# Patient Record
Sex: Male | Born: 1965
Health system: Southern US, Community
[De-identification: ages and names within clinical notes are randomized; demographics above are authoritative.]

## PROBLEM LIST (undated history)

## (undated) DIAGNOSIS — Z8679 Personal history of other diseases of the circulatory system: Secondary | ICD-10-CM

## (undated) DIAGNOSIS — Q631 Lobulated, fused and horseshoe kidney: Secondary | ICD-10-CM

## (undated) DIAGNOSIS — R519 Headache, unspecified: Secondary | ICD-10-CM

## (undated) DIAGNOSIS — I219 Acute myocardial infarction, unspecified: Secondary | ICD-10-CM

## (undated) DIAGNOSIS — I739 Peripheral vascular disease, unspecified: Secondary | ICD-10-CM

## (undated) DIAGNOSIS — J45909 Unspecified asthma, uncomplicated: Secondary | ICD-10-CM

## (undated) DIAGNOSIS — I10 Essential (primary) hypertension: Secondary | ICD-10-CM

## (undated) DIAGNOSIS — R51 Headache: Secondary | ICD-10-CM

## (undated) DIAGNOSIS — E7800 Pure hypercholesterolemia, unspecified: Secondary | ICD-10-CM

## (undated) DIAGNOSIS — I639 Cerebral infarction, unspecified: Secondary | ICD-10-CM

## (undated) DIAGNOSIS — T7840XA Allergy, unspecified, initial encounter: Secondary | ICD-10-CM

## (undated) HISTORY — PX: LEG SURGERY: SHX1003

## (undated) HISTORY — DX: Allergy, unspecified, initial encounter: T78.40XA

---

## 1999-05-19 ENCOUNTER — Emergency Department (HOSPITAL_COMMUNITY): Admission: EM | Admit: 1999-05-19 | Discharge: 1999-05-20 | Payer: Self-pay

## 2003-09-10 ENCOUNTER — Emergency Department (HOSPITAL_COMMUNITY): Admission: EM | Admit: 2003-09-10 | Discharge: 2003-09-10 | Payer: Self-pay | Admitting: Emergency Medicine

## 2004-01-21 ENCOUNTER — Ambulatory Visit: Payer: Self-pay | Admitting: Internal Medicine

## 2004-02-03 ENCOUNTER — Ambulatory Visit (HOSPITAL_COMMUNITY): Admission: RE | Admit: 2004-02-03 | Discharge: 2004-02-03 | Payer: Self-pay | Admitting: Internal Medicine

## 2006-10-12 ENCOUNTER — Emergency Department (HOSPITAL_COMMUNITY): Admission: EM | Admit: 2006-10-12 | Discharge: 2006-10-12 | Payer: Self-pay | Admitting: Emergency Medicine

## 2006-12-26 ENCOUNTER — Emergency Department (HOSPITAL_COMMUNITY): Admission: EM | Admit: 2006-12-26 | Discharge: 2006-12-27 | Payer: Self-pay | Admitting: Emergency Medicine

## 2007-01-15 ENCOUNTER — Emergency Department (HOSPITAL_COMMUNITY): Admission: EM | Admit: 2007-01-15 | Discharge: 2007-01-15 | Payer: Self-pay | Admitting: Family Medicine

## 2007-04-04 ENCOUNTER — Emergency Department (HOSPITAL_COMMUNITY): Admission: EM | Admit: 2007-04-04 | Discharge: 2007-04-04 | Payer: Self-pay | Admitting: Family Medicine

## 2007-11-04 ENCOUNTER — Inpatient Hospital Stay (HOSPITAL_COMMUNITY): Admission: EM | Admit: 2007-11-04 | Discharge: 2007-11-05 | Payer: Self-pay | Admitting: Family Medicine

## 2007-11-04 ENCOUNTER — Encounter (INDEPENDENT_AMBULATORY_CARE_PROVIDER_SITE_OTHER): Payer: Self-pay | Admitting: Internal Medicine

## 2007-11-04 ENCOUNTER — Ambulatory Visit: Payer: Self-pay | Admitting: Vascular Surgery

## 2008-01-30 ENCOUNTER — Ambulatory Visit: Payer: Self-pay | Admitting: Internal Medicine

## 2008-06-29 ENCOUNTER — Encounter: Payer: Self-pay | Admitting: Family Medicine

## 2008-06-29 ENCOUNTER — Ambulatory Visit: Payer: Self-pay | Admitting: Internal Medicine

## 2008-06-29 LAB — CONVERTED CEMR LAB
Alkaline Phosphatase: 44 units/L (ref 39–117)
BUN: 12 mg/dL (ref 6–23)
Basophils Absolute: 0.1 10*3/uL (ref 0.0–0.1)
Basophils Relative: 1 % (ref 0–1)
Creatinine, Ser: 0.94 mg/dL (ref 0.40–1.50)
Eosinophils Absolute: 0.2 10*3/uL (ref 0.0–0.7)
Glucose, Bld: 66 mg/dL — ABNORMAL LOW (ref 70–99)
HDL: 50 mg/dL (ref >39)
Hemoglobin: 13.8 g/dL (ref 13.0–17.0)
LDL Cholesterol: 156 mg/dL — ABNORMAL HIGH (ref 0–99)
MCHC: 31.7 g/dL (ref 30.0–36.0)
Monocytes Absolute: 0.8 10*3/uL (ref 0.1–1.0)
Neutro Abs: 4.8 10*3/uL (ref 1.7–7.7)
RDW: 15 % (ref 11.5–15.5)
Sodium: 143 meq/L (ref 135–145)
Total Bilirubin: 0.5 mg/dL (ref 0.3–1.2)
Total CHOL/HDL Ratio: 4.5
Total Protein: 7.5 g/dL (ref 6.0–8.3)
Triglycerides: 98 mg/dL (ref <150)
VLDL: 20 mg/dL (ref 0–40)

## 2008-07-16 ENCOUNTER — Ambulatory Visit: Payer: Self-pay | Admitting: Internal Medicine

## 2008-07-23 ENCOUNTER — Ambulatory Visit: Payer: Self-pay | Admitting: Internal Medicine

## 2008-08-27 ENCOUNTER — Emergency Department (HOSPITAL_COMMUNITY): Admission: EM | Admit: 2008-08-27 | Discharge: 2008-08-28 | Payer: Self-pay | Admitting: Emergency Medicine

## 2008-10-11 ENCOUNTER — Observation Stay (HOSPITAL_COMMUNITY): Admission: EM | Admit: 2008-10-11 | Discharge: 2008-10-12 | Payer: Self-pay | Admitting: Emergency Medicine

## 2008-10-11 ENCOUNTER — Emergency Department (HOSPITAL_COMMUNITY): Admission: EM | Admit: 2008-10-11 | Discharge: 2008-10-11 | Payer: Self-pay | Admitting: Emergency Medicine

## 2009-04-11 ENCOUNTER — Emergency Department (HOSPITAL_COMMUNITY): Admission: EM | Admit: 2009-04-11 | Discharge: 2009-04-11 | Payer: Self-pay | Admitting: Emergency Medicine

## 2009-07-06 ENCOUNTER — Encounter (INDEPENDENT_AMBULATORY_CARE_PROVIDER_SITE_OTHER): Payer: Self-pay | Admitting: Cardiology

## 2009-07-06 ENCOUNTER — Ambulatory Visit (HOSPITAL_COMMUNITY): Admission: RE | Admit: 2009-07-06 | Discharge: 2009-07-06 | Payer: Self-pay | Admitting: Cardiology

## 2009-12-22 ENCOUNTER — Emergency Department (HOSPITAL_COMMUNITY)
Admission: EM | Admit: 2009-12-22 | Discharge: 2009-12-22 | Payer: Self-pay | Source: Home / Self Care | Admitting: Emergency Medicine

## 2010-01-29 ENCOUNTER — Encounter: Payer: Self-pay | Admitting: Cardiology

## 2010-04-13 LAB — POCT CARDIAC MARKERS
CKMB, poc: 4 ng/mL (ref 1.0–8.0)
CKMB, poc: 4.2 ng/mL (ref 1.0–8.0)
Myoglobin, poc: 124 ng/mL (ref 12–200)
Troponin i, poc: <0.05 ng/mL (ref 0.00–0.09)

## 2010-04-13 LAB — CARDIAC PANEL(CRET KIN+CKTOT+MB+TROPI)
CK, MB: 5.6 ng/mL — ABNORMAL HIGH (ref 0.3–4.0)
Relative Index: 0.6 (ref 0.0–2.5)
Relative Index: 0.7 (ref 0.0–2.5)
Total CK: 856 U/L — ABNORMAL HIGH (ref 7–232)
Troponin I: 0.01 ng/mL (ref 0.00–0.06)

## 2010-04-13 LAB — DIFFERENTIAL
Basophils Absolute: 0 10*3/uL (ref 0.0–0.1)
Lymphocytes Relative: 23 % (ref 12–46)
Monocytes Absolute: 0.9 10*3/uL (ref 0.1–1.0)
Monocytes Relative: 10 % (ref 3–12)
Neutro Abs: 5.9 10*3/uL (ref 1.7–7.7)
Neutrophils Relative %: 66 % (ref 43–77)

## 2010-04-13 LAB — CBC
HCT: 41 % (ref 39.0–52.0)
MCHC: 33.5 g/dL (ref 30.0–36.0)
MCV: 92.1 fL (ref 78.0–100.0)
Platelets: 204 10*3/uL (ref 150–400)
RDW: 13.5 % (ref 11.5–15.5)

## 2010-04-13 LAB — TSH: TSH: 0.987 u[IU]/mL (ref 0.350–4.500)

## 2010-04-13 LAB — HEMOGLOBIN A1C: Mean Plasma Glucose: 117 mg/dL

## 2010-04-13 LAB — LIPID PANEL: Triglycerides: 148 mg/dL (ref <150)

## 2010-04-13 LAB — COMPREHENSIVE METABOLIC PANEL
Albumin: 3.7 g/dL (ref 3.5–5.2)
BUN: 6 mg/dL (ref 6–23)
Creatinine, Ser: 0.87 mg/dL (ref 0.4–1.5)
Glucose, Bld: 95 mg/dL (ref 70–99)
Total Protein: 6.6 g/dL (ref 6.0–8.3)

## 2010-04-13 LAB — CK TOTAL AND CKMB (NOT AT ARMC)
CK, MB: 7.7 ng/mL — ABNORMAL HIGH (ref 0.3–4.0)
Relative Index: 0.7 (ref 0.0–2.5)

## 2010-04-13 LAB — PROTIME-INR: Prothrombin Time: 13.3 seconds (ref 11.6–15.2)

## 2010-04-13 LAB — MAGNESIUM: Magnesium: 2.2 mg/dL (ref 1.5–2.5)

## 2010-05-23 NOTE — H&P (Signed)
NAMEJAKYREN, Jesse Sanders              ACCOUNT NO.:  [id number]   MEDICAL RECORD NO.:  [id number]          PATIENT TYPE:  EMS   LOCATION:  URG                          FACILITY:  [facility]   PHYSICIAN:  Maryla Morrow, MD        DATE OF BIRTH:  1965/03/21   DATE OF ADMISSION:  11/04/2007  DATE OF DISCHARGE:                              HISTORY & PHYSICAL   PRIMARY CARE PHYSICIAN:  Unassigned.   HISTORY OF PRESENT ILLNESS:  Jesse Sanders is a very pleasant 45 year old  gentleman with history of tobacco abuse and no other medical history who  presents today with chief complaint of sudden onset of right-sided  weakness with slurred speech, which resolved spontaneously.  He was  concerned about the symptoms and came to the ER for the evaluation.  Here he had no evidence of any speech or movement deficit per ER  evaluation.  He denied any fever or chills.  He denied any trauma to the  head.  He denies any chest pain, palpitations, or any seizures prior or  after the procedure.   PAST MEDICAL HISTORY:  Tobacco abuse, otherwise negative for  hypertension, diabetes, or hyperlipidemia.   FAMILY MEDICAL HISTORY:  Positive for history of CVA x2 in father,  otherwise negative for diabetes or hypertension.   PAST SURGICAL HISTORY:  None known.   MEDICATIONS:  None.   ALLERGIES:  No known drug allergies.   SOCIAL HISTORY:  The patient is smoking about a pack of cigarettes a  day.  Denies tobacco, alcohol, or drug abuse.  He is employed and lives  with his wife.   REVIEW OF SYSTEMS:  All pertinent positive as noted in the HPI.  Rest  all are negative.  All system reviewed with patient in detail.   PHYSICAL EXAMINATION:  VITAL SIGNS:  Temperature at present 98.2, blood  pressure 142/80, pulse 72, respiration 18, and saturations 100% on room  air.  GENERAL:  The patient is well built, well nourished, alert and oriented  to time, place, and person.  No acute distress.  HEENT:  Pupils equal, round, and  reactive to light.  Extraocular muscles  intact.  Head is atraumatic and normocephalic.  Oropharynx is clear.  NECK:  Supple.  No JVD.  No lymphadenopathy.  CHEST:  Clear to auscultation bilaterally.  Equal expansion bilaterally.  HEART:  Regular rate and rhythm.  S1 and S2 normal.  ABDOMEN:  Soft and nontender.  EXTREMITIES:  No evidence of cyanosis or clubbing.  Distal pulses 2+  bilaterally.  NEUROLOGIC:  Strength is 5/5 in 4 extremities.  Sensation is intact.  Speech is normal.  PSYCHIATRIC:  Mood and affect normal.  SKIN:  No evidence of rash or ulceration.   LABORATORY DATA:  Labs are reviewed and are unremarkable.   ASSESSMENT AND PLAN:  1. Transient ischemic attack versus cerebrovascular accident.  2. Tobacco abuse.  3. Family history of cerebrovascular accident.   RECOMMENDATIONS:  1. We will admit the patient to observation and will have CT with MRI      and MRA of the brain as  well as carotid ultrasound and 2-D      echocardiogram.  Start the patient on aspirin.  2. We will also check fasting lipid panel and hemoglobin A1c.  3. P.r.n. medications.      Maryla Morrow, MD  Electronically Signed     AP/MEDQ  D:  11/04/2007  T:  11/04/2007  Job:  [id number]

## 2010-05-26 NOTE — Discharge Summary (Signed)
Jesse Sanders, Jesse Sanders              ACCOUNT NO.:  [id number]   MEDICAL RECORD NO.:  [id number]          PATIENT TYPE:  INP   LOCATION:  3013                         FACILITY:  [facility]   PHYSICIAN:  Lonia Blood, M.D.       DATE OF BIRTH:  [birth year]   DATE OF ADMISSION:  11/04/2007  DATE OF DISCHARGE:  11/05/2007                               DISCHARGE SUMMARY   PRIMARY CARE PHYSICIAN:  [facility].   DISCHARGE DIAGNOSES:  1. Transient ischemic attack.  2. Cerebral small vessel disease.  3. Hyperlipidemia.  4. Tobacco abuse.  5. Alcohol use.   DISCHARGE MEDICATIONS:  1. Aspirin 81 mg daily.  2. Chantix starter pack.  3. Simvastatin 20 mg at bedtime.   CONDITION ON DISCHARGE:  Mr. Baney is discharged in good condition.  He  is alert, oriented, and neurologically intact.  He was told to follow up  with [facility].   PROCEDURES DURING THIS ADMISSION:  1. The patient underwent carotid Dopplers which were negative for ICA      stenosis.  2. Transthoracic echocardiogram which was negative for thrombi in the      heart.  3. MRI and MRA of the brain.  Findings of no acute stroke, nonspecific      white matter lesions, small vessel disease, and normal intracranial      MR angiography of the large and medium-sized vessels.   HISTORY AND PHYSICAL:  Refer to the dictated H&P which was on November 04, 2007, by Dr. Allena Katz.   HOSPITAL COURSE:  Mr. Seppala was admitted with transient ischemic attack  symptoms on November 04, 2007.  The patient was placed on telemetry and  he had serial neurological exams.  He remained neurologically intact  throughout this admission.  His workup revealed presence of small vessel  disease and this was the probable cause of his TIA.  It was stressed to  the patient the importance of smoking cessation, aspirin usage, and  follow up with the primary care physician.  Mr. Linse was discharged in  good condition on November 05, 2007.     Lonia Blood, M.D.  Electronically Signed    SL/MEDQ  D:  11/19/2007  T:  11/20/2007  Job:  [id number]   cc:   Melvern Banker

## 2010-09-27 LAB — POCT URINALYSIS DIP (DEVICE)
Bilirubin Urine: NEGATIVE
Glucose, UA: NEGATIVE
Ketones, ur: 15 — AB
Operator id: 247071
Specific Gravity, Urine: 1.02

## 2010-10-09 LAB — COMPREHENSIVE METABOLIC PANEL
ALT: 13
Albumin: 3.7
Alkaline Phosphatase: 32 — ABNORMAL LOW
BUN: 7
Calcium: 9.3
Glucose, Bld: 83
Potassium: 4.8
Sodium: 140
Total Protein: 6.1

## 2010-10-09 LAB — URINE DRUGS OF ABUSE SCREEN W ALC, ROUTINE (REF LAB)
Amphetamine Screen, Ur: NEGATIVE
Cocaine Metabolites: NEGATIVE
Creatinine,U: 48
Ethyl Alcohol: <10
Marijuana Metabolite: NEGATIVE
Opiate Screen, Urine: NEGATIVE
Propoxyphene: NEGATIVE

## 2010-10-09 LAB — LIPID PANEL
Cholesterol: 168
HDL: 45
LDL Cholesterol: 102 — ABNORMAL HIGH
Triglycerides: 104

## 2010-10-09 LAB — URINE MICROSCOPIC-ADD ON

## 2010-10-09 LAB — URINALYSIS, ROUTINE W REFLEX MICROSCOPIC
Glucose, UA: NEGATIVE
Nitrite: NEGATIVE
Specific Gravity, Urine: 1.026
pH: 6

## 2010-10-09 LAB — RAPID URINE DRUG SCREEN, HOSP PERFORMED
Amphetamines: NOT DETECTED
Barbiturates: NOT DETECTED
Benzodiazepines: NOT DETECTED
Tetrahydrocannabinol: NOT DETECTED

## 2010-10-09 LAB — DIFFERENTIAL
Basophils Relative: 1
Lymphs Abs: 4.1 — ABNORMAL HIGH
Monocytes Absolute: 0.9
Monocytes Relative: 8
Neutro Abs: 6.4
Neutrophils Relative %: 55

## 2010-10-09 LAB — CBC
MCHC: 34.5
Platelets: 290
RDW: 14.2

## 2010-10-09 LAB — HEMOGLOBIN A1C: Mean Plasma Glucose: 117

## 2010-10-09 LAB — TSH: TSH: 1.389

## 2010-10-13 LAB — URINALYSIS, ROUTINE W REFLEX MICROSCOPIC
Glucose, UA: NEGATIVE
Ketones, ur: 15 — AB
Protein, ur: 100 — AB
Urobilinogen, UA: 1

## 2010-10-13 LAB — URINE CULTURE: Colony Count: 60000

## 2010-10-13 LAB — I-STAT 8, (EC8 V) (CONVERTED LAB)
Acid-Base Excess: 3 — ABNORMAL HIGH
Bicarbonate: 29.6 — ABNORMAL HIGH
Chloride: 104
HCT: 46
Hemoglobin: 15.6
Operator id: 208821
Sodium: 140
pCO2, Ven: 51.8 — ABNORMAL HIGH

## 2010-10-13 LAB — CBC
Hemoglobin: 13.7
MCHC: 33.8
RBC: 4.46
WBC: 10.2

## 2010-10-13 LAB — POCT I-STAT CREATININE: Creatinine, Ser: 1.1

## 2010-10-13 LAB — DIFFERENTIAL
Basophils Relative: 0
Lymphocytes Relative: 27
Monocytes Absolute: 0.8
Monocytes Relative: 7
Neutro Abs: 6.5
Neutrophils Relative %: 64

## 2010-10-13 LAB — URINE MICROSCOPIC-ADD ON

## 2011-01-09 DIAGNOSIS — I639 Cerebral infarction, unspecified: Secondary | ICD-10-CM

## 2011-01-09 HISTORY — DX: Cerebral infarction, unspecified: I63.9

## 2011-04-07 ENCOUNTER — Emergency Department (HOSPITAL_BASED_OUTPATIENT_CLINIC_OR_DEPARTMENT_OTHER)
Admission: EM | Admit: 2011-04-07 | Discharge: 2011-04-07 | Disposition: A | Discharge status: Home / Self Care | Payer: 59 | Attending: Emergency Medicine | Admitting: Emergency Medicine

## 2011-04-07 ENCOUNTER — Encounter (HOSPITAL_BASED_OUTPATIENT_CLINIC_OR_DEPARTMENT_OTHER): Payer: Self-pay | Admitting: *Deleted

## 2011-04-07 ENCOUNTER — Emergency Department (INDEPENDENT_AMBULATORY_CARE_PROVIDER_SITE_OTHER): Payer: 59

## 2011-04-07 DIAGNOSIS — M25579 Pain in unspecified ankle and joints of unspecified foot: Secondary | ICD-10-CM

## 2011-04-07 DIAGNOSIS — M1990 Unspecified osteoarthritis, unspecified site: Secondary | ICD-10-CM | POA: Insufficient documentation

## 2011-04-07 DIAGNOSIS — Z8673 Personal history of transient ischemic attack (TIA), and cerebral infarction without residual deficits: Secondary | ICD-10-CM | POA: Insufficient documentation

## 2011-04-07 DIAGNOSIS — I10 Essential (primary) hypertension: Secondary | ICD-10-CM | POA: Insufficient documentation

## 2011-04-07 DIAGNOSIS — X58XXXA Exposure to other specified factors, initial encounter: Secondary | ICD-10-CM | POA: Insufficient documentation

## 2011-04-07 DIAGNOSIS — E7800 Pure hypercholesterolemia, unspecified: Secondary | ICD-10-CM | POA: Insufficient documentation

## 2011-04-07 HISTORY — DX: Lobulated, fused and horseshoe kidney: Q63.1

## 2011-04-07 HISTORY — DX: Pure hypercholesterolemia, unspecified: E78.00

## 2011-04-07 HISTORY — DX: Cerebral infarction, unspecified: I63.9

## 2011-04-07 HISTORY — DX: Peripheral vascular disease, unspecified: I73.9

## 2011-04-07 HISTORY — DX: Essential (primary) hypertension: I10

## 2011-04-07 MED ORDER — TRAMADOL HCL 50 MG PO TABS: 50.0000 mg | ORAL_TABLET | Freq: Four times a day (QID) | ORAL | Status: AC | PRN

## 2011-04-07 NOTE — ED Provider Notes (Signed)
History     CSN: [id number]  Arrival date & time 04/07/11  1951   First MD Initiated Contact with Patient 04/07/11 2234      Chief Complaint  Patient presents with  . Ankle Pain    (Consider location/radiation/quality/duration/timing/severity/associated sxs/prior treatment) HPI Comments: Pt denies any known injury:pt states that he has taken his father gout medication and it seems to help:pt is unsure of what it was:pt states that he has not had redness, warmth or swelling to area  Patient is a 46 y.o. male presenting with ankle pain. The history is provided by the patient. No language interpreter was used.  Ankle Pain  The incident occurred more than 1 week ago. There was no injury mechanism. The pain is present in the left ankle. The quality of the pain is described as aching. The pain is moderate. The pain has been constant since onset. Pertinent negatives include no loss of motion and no muscle weakness. He reports no foreign bodies present. The symptoms are aggravated by nothing.    Past Medical History  Diagnosis Date  . Horseshoe kidney   . Hypertension   . High cholesterol   . Small vessel disease   . Heart attack   . Stroke     Past Surgical History  Procedure Date  . Leg surgery     No family history on file.  History  Substance Use Topics  . Smoking status: Current Everyday Smoker  . Smokeless tobacco: Never Used  . Alcohol Use: No     last drink 3 years ago      Review of Systems  Constitutional: Negative.   Respiratory: Negative.   Cardiovascular: Negative.   Skin: Negative.   Neurological: Negative.     Allergies  Review of patient's allergies indicates no known allergies.  Home Medications  No current outpatient prescriptions on file.  BP 134/74  Pulse 76  Temp(Src) 98.6 F (37 C) (Oral)  Resp 18  Ht 5\' 11"  (1.803 m)  Wt 185 lb (83.915 kg)  BMI 25.80 kg/m2  SpO2 98%  Physical Exam  Nursing note and vitals  reviewed. Constitutional: He is oriented to person, place, and time. He appears well-developed and well-nourished.  Cardiovascular: Normal rate and regular rhythm.   Pulmonary/Chest: Effort normal and breath sounds normal.  Musculoskeletal: Normal range of motion.       No obvious swelling or deformity noted to the ankle:pt has full rom  Neurological: He is alert and oriented to person, place, and time.  Skin: Skin is warm.  Psychiatric: He has a normal mood and affect.    ED Course  Procedures (including critical care time)  Labs Reviewed - No data to display Dg Ankle Complete Left  04/07/2011  *RADIOLOGY REPORT*  Clinical Data: Lateral ankle pain.  No known injury.  LEFT ANKLE COMPLETE - 3+ VIEW  Comparison: None.  Findings: The left ankle appears intact.  No evidence of acute fracture or subluxation.  No focal bone lesion or bone destruction. No abnormal periosteal reaction.  Bone cortex and trabecular architecture appear intact.  Degenerative changes suggested in the midfoot tarsal joints.  Ankle mortis and talar dome appear intact. No radiopaque soft tissue foreign bodies.  IMPRESSION: No acute bony abnormalities identified.  Original Report Authenticated By: Marlon Pel, M.D.     1. Degenerative joint disease       MDM  Pt is instructed on finding:pt requesting aso for comfort  Teressa Lower, NP 04/07/11 [phone]

## 2011-04-07 NOTE — ED Notes (Signed)
Pt reports left ankle pain x 3 months- reports he has been taking his father's "gout pills"- denies known injury

## 2011-04-07 NOTE — Discharge Instructions (Signed)
Degenerative Arthritis  You have osteoarthritis. This is the wear and tear arthritis that comes with aging. It is also called degenerative arthritis. This is common in people past middle age. It is caused by stress on the joints. The large weight bearing joints of the lower extremities are most often affected. The knees, hips, back, neck, and hands can become painful, swollen, and stiff. This is the most common type of arthritis. It comes on with age, carrying too much weight, or from an injury.  Treatment includes resting the sore joint until the pain and swelling improve. Crutches or a walker may be needed for severe flares. Only take over-the-counter or prescription medicines for pain, discomfort, or fever as directed by your caregiver. Local heat therapy may improve motion. Cortisone shots into the joint are sometimes used to reduce pain and swelling during flares.  Osteoarthritis is usually not crippling and progresses slowly. There are things you can do to decrease pain:  · Avoid high impact activities.  · Exercise regularly.  · Low impact exercises such as walking, biking and swimming help to keep the muscles strong and keep normal joint function.  · Stretching helps to keep your range of motion.  · Lose weight if you are overweight. This reduces joint stress.  In severe cases when you have pain at rest or increasing disability, joint surgery may be helpful. See your caregiver for follow-up treatment as recommended.   SEEK IMMEDIATE MEDICAL CARE IF:   · You have severe joint pain.  · Marked swelling and redness in your joint develops.  · You develop a high fever.  Document Released: 12/25/2004 Document Revised: 12/14/2010 Document Reviewed: 05/27/2006  Exit[employer]® Patient Information ©2012 [employer].

## 2011-04-08 NOTE — ED Provider Notes (Signed)
Medical screening examination/treatment/procedure(s) were performed by non-physician practitioner and as supervising physician I was immediately available for consultation/collaboration.  Infant Doane, MD 04/08/11 0001 

## 2011-05-10 ENCOUNTER — Ambulatory Visit: Payer: 59 | Admitting: Internal Medicine

## 2011-06-15 ENCOUNTER — Ambulatory Visit: Payer: 59 | Admitting: Internal Medicine

## 2011-06-15 DIAGNOSIS — Z0289 Encounter for other administrative examinations: Secondary | ICD-10-CM

## 2012-01-09 DIAGNOSIS — I219 Acute myocardial infarction, unspecified: Secondary | ICD-10-CM

## 2012-01-09 HISTORY — DX: Acute myocardial infarction, unspecified: I21.9

## 2012-02-26 ENCOUNTER — Emergency Department (HOSPITAL_COMMUNITY)
Admission: EM | Admit: 2012-02-26 | Discharge: 2012-02-27 | Disposition: A | Discharge status: Home / Self Care | Payer: 59 | Attending: Emergency Medicine | Admitting: Emergency Medicine

## 2012-02-26 ENCOUNTER — Encounter (HOSPITAL_COMMUNITY): Payer: Self-pay | Admitting: Emergency Medicine

## 2012-02-26 ENCOUNTER — Emergency Department (HOSPITAL_COMMUNITY): Payer: 59

## 2012-02-26 DIAGNOSIS — Z8673 Personal history of transient ischemic attack (TIA), and cerebral infarction without residual deficits: Secondary | ICD-10-CM | POA: Insufficient documentation

## 2012-02-26 DIAGNOSIS — M531 Cervicobrachial syndrome: Secondary | ICD-10-CM | POA: Insufficient documentation

## 2012-02-26 DIAGNOSIS — F17200 Nicotine dependence, unspecified, uncomplicated: Secondary | ICD-10-CM | POA: Insufficient documentation

## 2012-02-26 DIAGNOSIS — Z8674 Personal history of sudden cardiac arrest: Secondary | ICD-10-CM | POA: Insufficient documentation

## 2012-02-26 DIAGNOSIS — Z23 Encounter for immunization: Secondary | ICD-10-CM | POA: Insufficient documentation

## 2012-02-26 DIAGNOSIS — S61219A Laceration without foreign body of unspecified finger without damage to nail, initial encounter: Secondary | ICD-10-CM

## 2012-02-26 DIAGNOSIS — S61209A Unspecified open wound of unspecified finger without damage to nail, initial encounter: Secondary | ICD-10-CM | POA: Insufficient documentation

## 2012-02-26 DIAGNOSIS — W261XXA Contact with sword or dagger, initial encounter: Secondary | ICD-10-CM | POA: Insufficient documentation

## 2012-02-26 DIAGNOSIS — Z862 Personal history of diseases of the blood and blood-forming organs and certain disorders involving the immune mechanism: Secondary | ICD-10-CM | POA: Insufficient documentation

## 2012-02-26 DIAGNOSIS — Y93G9 Activity, other involving cooking and grilling: Secondary | ICD-10-CM | POA: Insufficient documentation

## 2012-02-26 DIAGNOSIS — Y9289 Other specified places as the place of occurrence of the external cause: Secondary | ICD-10-CM | POA: Insufficient documentation

## 2012-02-26 DIAGNOSIS — Z8639 Personal history of other endocrine, nutritional and metabolic disease: Secondary | ICD-10-CM | POA: Insufficient documentation

## 2012-02-26 DIAGNOSIS — Z7982 Long term (current) use of aspirin: Secondary | ICD-10-CM | POA: Insufficient documentation

## 2012-02-26 DIAGNOSIS — W260XXA Contact with knife, initial encounter: Secondary | ICD-10-CM | POA: Insufficient documentation

## 2012-02-26 DIAGNOSIS — Z8679 Personal history of other diseases of the circulatory system: Secondary | ICD-10-CM | POA: Insufficient documentation

## 2012-02-26 DIAGNOSIS — I10 Essential (primary) hypertension: Secondary | ICD-10-CM | POA: Insufficient documentation

## 2012-02-26 MED ORDER — TETANUS-DIPHTH-ACELL PERTUSSIS 5-2.5-18.5 LF-MCG/0.5 IM SUSP
0.5000 mL | Freq: Once | INTRAMUSCULAR | Status: AC
  Administered 2012-02-26: 0.5 mL via INTRAMUSCULAR

## 2012-02-26 MED ORDER — TETANUS-DIPHTH-ACELL PERTUSSIS 5-2.5-18.5 LF-MCG/0.5 IM SUSP
INTRAMUSCULAR | Status: AC
  Administered 2012-02-26: 0.5 mL via INTRAMUSCULAR
  Filled 2012-02-26: qty 0.5

## 2012-02-26 NOTE — ED Notes (Signed)
Pt was chopping lettuce at work and mistakenly cut his L pinky finger.

## 2012-02-26 NOTE — ED Provider Notes (Signed)
History    This chart was scribed for non-physician practitioner working with Gilda Crease, * by Toya Smothers, ED Scribe. This patient was seen in room TR11C/TR11C and the patient's care was started at 11:29.  CSN: [id number]  Arrival date & time 02/26/12  2012   First MD Initiated Contact with Patient 02/26/12 2328      Chief Complaint  Patient presents with  . Finger Injury    Patient is a 47 y.o. male presenting with skin laceration. The history is provided by the patient. No language interpreter was used.  Laceration Location:  Finger Finger laceration location:  L little finger Quality: avulsion   Bleeding: controlled   Time since incident:  2 hours Pain details:    Quality:  Dull   Severity:  Mild   Timing:  Rare   Progression:  Resolved Foreign body present:  No foreign bodies Relieved by:  Pressure Worsened by:  Nothing tried Ineffective treatments:  None tried Tetanus status:  Unknown   Harlyn Italiano is a 47 y.o. male with h/o HTN, who presents to the Emergency Department complaining of superficial laceration flap to the distal aspect of the right little finger which occurred 2 hours PTA. Per Pt, pain has subsided, though it was initially sharp and aching. Onset occurred while cutting food with a sharp knife. Bleeding was moderate, though controlled prior to arrival. No fever, chills, cough, congestion, rhinorrhea, chest pain, SOB, or n/v/d. Pt denies use of tobacco, alcohol, and illicit drug use. Pt is unaware of last tetanus shot and is non-diabetic. No pertinent surgical Hx denoted.     Past Medical History  Diagnosis Date  . Horseshoe kidney   . Hypertension   . High cholesterol   . Small vessel disease   . Heart attack   . Stroke     Past Surgical History  Procedure Laterality Date  . Leg surgery      History reviewed. No pertinent family history.  History  Substance Use Topics  . Smoking status: Current Every Day Smoker  . Smokeless  tobacco: Never Used  . Alcohol Use: No     Comment: last drink 3 years ago      Review of Systems  Skin: Positive for wound.  All other systems reviewed and are negative.    Allergies  Review of patient's allergies indicates no known allergies.  Home Medications   Current Outpatient Rx  Name  Route  Sig  Dispense  Refill  . aspirin EC 81 MG tablet   Oral   Take 81 mg by mouth daily.           BP 142/94  Pulse 72  Temp(Src) 98.5 F (36.9 C) (Oral)  Resp 16  SpO2 96%  Physical Exam  Nursing note and vitals reviewed. Constitutional: He appears well-developed and well-nourished. No distress.  HENT:  Head: Normocephalic and atraumatic.  Right Ear: External ear normal.  Left Ear: External ear normal.  Eyes: Conjunctivae are normal.  Cardiovascular: Normal rate.   Pulmonary/Chest: Effort normal. No respiratory distress.  Musculoskeletal: He exhibits no edema.  Neurological: He is alert. Cranial nerve deficit: no gross deficits.  Skin: Skin is warm and dry. No rash noted.  Superficial flap laceration to distal aspect of the left fifth pharyngeal.   Psychiatric: He has a normal mood and affect.    ED Course  Procedures  DIAGNOSTIC STUDIES: Oxygen Saturation is 96% on room air, normal by my interpretation.    COORDINATION OF  CARE: 20:46- Ordered DG Finger Little Left 1 time imaging. 23:29- Evaluated Pt. Pt is awake, alert, and without distress. 23:33- Patient understand and agree with initial ED impression and plan with expectations set for ED visit. 23:37- Ordered Wound care and Apply finger splint static Once.  Labs Reviewed - No data to display Dg Finger Little Left  02/26/2012  *RADIOLOGY REPORT*  Clinical Data: Laceration to tip of fifth finger  LEFT LITTLE FINGER 2+V  Comparison: None.  Findings: No acute osseous injury or retained radiopaque foreign body.  Irregularity of the soft tissues of the distal finger consistent with laceration.  IMPRESSION:  Laceration of the distal fifth finger without evidence of underlying osseous involvement or retained radiopaque foreign body.   Original Report Authenticated By: Malachy Moan, M.D.      No diagnosis found.  Patient discussed with and seen by Dr. Blinda Leatherwood. After discussion, patient has opted for healing via secondary intention.  Wound care provided, protective splint applied, tetanus updated   MDM    I personally performed the services described in this documentation, which was scribed in my presence. The recorded information has been reviewed and is accurate.        Jimmye Norman, NP 02/27/12 [phone]

## 2012-02-26 NOTE — ED Notes (Signed)
Patient reports that he cut his left pinky finger with a knife while cutting lettuce at work.  Denies that it is a worker's comp case.  Bleeding controlled at this time; no active bleeding noted.

## 2012-02-26 NOTE — ED Notes (Signed)
Cleaned pt's finger with sterile saline.

## 2012-02-27 NOTE — ED Provider Notes (Signed)
Medical screening examination/treatment/procedure(s) were performed by non-physician practitioner and as supervising physician I was immediately available for consultation/collaboration.  Christopher J. Pollina, MD 02/27/12 1214 

## 2015-02-19 ENCOUNTER — Encounter (HOSPITAL_COMMUNITY): Payer: Self-pay | Admitting: Emergency Medicine

## 2015-02-19 ENCOUNTER — Emergency Department (INDEPENDENT_AMBULATORY_CARE_PROVIDER_SITE_OTHER)
Admission: EM | Admit: 2015-02-19 | Discharge: 2015-02-19 | Disposition: A | Discharge status: Home / Self Care | Payer: Self-pay | Source: Home / Self Care | Attending: Family Medicine | Admitting: Family Medicine

## 2015-02-19 DIAGNOSIS — K047 Periapical abscess without sinus: Secondary | ICD-10-CM

## 2015-02-19 MED ORDER — CLINDAMYCIN HCL 300 MG PO CAPS: 300.0000 mg | ORAL_CAPSULE | Freq: Three times a day (TID) | ORAL | Status: DC

## 2015-02-19 MED ORDER — IBUPROFEN 800 MG PO TABS: 800.0000 mg | ORAL_TABLET | Freq: Three times a day (TID) | ORAL | Status: DC

## 2015-02-19 NOTE — ED Notes (Signed)
Reports right lower toothache that started Wednesday.  Patient reports swelling has occurred since last night.

## 2015-02-19 NOTE — ED Provider Notes (Addendum)
CSN: [id number]     Arrival date & time 02/19/15  1700 History   First MD Initiated Contact with Patient 02/19/15 1725     Chief Complaint  Patient presents with  . Dental Pain  . Facial Swelling   (Consider location/radiation/quality/duration/timing/severity/associated sxs/prior Treatment) Patient is a 50 y.o. male presenting with tooth pain. The history is provided by the patient.  Dental Pain Location:  Lower Lower teeth location:  30/RL 1st molar Quality:  Throbbing Severity:  Moderate Onset quality:  Gradual Duration:  3 days Progression:  Worsening Chronicity:  New Context: abscess, dental caries and poor dentition   Relieved by:  None tried Worsened by:  Nothing tried Ineffective treatments:  None tried Associated symptoms: facial swelling   Associated symptoms: no fever   Associated symptoms comment:  Onset last eve. Risk factors: lack of dental care and smoking   Risk factors: no alcohol problem     Past Medical History  Diagnosis Date  . Horseshoe kidney   . Hypertension   . High cholesterol   . Small vessel disease (HCC)   . Heart attack (HCC)   . Stroke [facility])    Past Surgical History  Procedure Laterality Date  . Leg surgery     No family history on file. Social History  Substance Use Topics  . Smoking status: Current Every Day Smoker  . Smokeless tobacco: Never Used  . Alcohol Use: No     Comment: last drink 3 years ago    Review of Systems  Constitutional: Negative.  Negative for fever.  HENT: Positive for dental problem and facial swelling.   All other systems reviewed and are negative.   Allergies  Review of patient's allergies indicates no known allergies.  Home Medications   Prior to Admission medications   Medication Sig Start Date End Date Taking? Authorizing Provider  aspirin EC 81 MG tablet Take 81 mg by mouth daily.    Historical Provider, MD  clindamycin (CLEOCIN) 300 MG capsule Take 1 capsule (300 mg total) by mouth 3 (three)  times daily. 02/19/15   Linna Hoff, MD  ibuprofen (ADVIL,MOTRIN) 800 MG tablet Take 1 tablet (800 mg total) by mouth 3 (three) times daily. 02/19/15   Linna Hoff, MD   Meds Ordered and Administered this Visit  Medications - No data to display  BP 153/106 mmHg  Pulse 89  Temp(Src) 99.4 F (37.4 C) (Oral)  Resp 18  SpO2 98% No data found.   Physical Exam  Constitutional: He is oriented to person, place, and time. He appears well-developed and well-nourished. He appears distressed.  HENT:  Right Ear: External ear normal.  Left Ear: External ear normal.  Mouth/Throat: Uvula is midline and mucous membranes are normal. Oral lesions present. Abnormal dentition.    Neck: Normal range of motion. Neck supple.  Lymphadenopathy:    He has no cervical adenopathy.  Neurological: He is alert and oriented to person, place, and time.  Skin: Skin is warm.  Nursing note and vitals reviewed.   ED Course  Procedures (including critical care time)  Labs Review Labs Reviewed - No data to display  Imaging Review No results found.   Visual Acuity Review  Right Eye Distance:   Left Eye Distance:   Bilateral Distance:    Right Eye Near:   Left Eye Near:    Bilateral Near:         MDM   1. Abscess, dental     Meds ordered this  encounter  Medications  . ibuprofen (ADVIL,MOTRIN) 800 MG tablet    Sig: Take 1 tablet (800 mg total) by mouth 3 (three) times daily.    Dispense:  30 tablet    Refill:  0  . clindamycin (CLEOCIN) 300 MG capsule    Sig: Take 1 capsule (300 mg total) by mouth 3 (three) times daily.    Dispense:  21 capsule    Refill:  0      Linna Hoff, MD 02/19/15 1746  Linna Hoff, MD 02/19/15 [phone]

## 2015-02-19 NOTE — Discharge Instructions (Signed)
Take medicine as prescribed, see your dentist as soon as possible °

## 2015-04-03 ENCOUNTER — Emergency Department (HOSPITAL_BASED_OUTPATIENT_CLINIC_OR_DEPARTMENT_OTHER)
Admission: EM | Admit: 2015-04-03 | Discharge: 2015-04-03 | Disposition: A | Discharge status: Home / Self Care | Payer: No Typology Code available for payment source | Attending: Emergency Medicine | Admitting: Emergency Medicine

## 2015-04-03 ENCOUNTER — Encounter (HOSPITAL_BASED_OUTPATIENT_CLINIC_OR_DEPARTMENT_OTHER): Payer: Self-pay | Admitting: *Deleted

## 2015-04-03 DIAGNOSIS — I10 Essential (primary) hypertension: Secondary | ICD-10-CM | POA: Insufficient documentation

## 2015-04-03 DIAGNOSIS — Q631 Lobulated, fused and horseshoe kidney: Secondary | ICD-10-CM | POA: Diagnosis not present

## 2015-04-03 DIAGNOSIS — K047 Periapical abscess without sinus: Secondary | ICD-10-CM | POA: Diagnosis not present

## 2015-04-03 DIAGNOSIS — F17200 Nicotine dependence, unspecified, uncomplicated: Secondary | ICD-10-CM | POA: Diagnosis not present

## 2015-04-03 DIAGNOSIS — R220 Localized swelling, mass and lump, head: Secondary | ICD-10-CM | POA: Diagnosis present

## 2015-04-03 DIAGNOSIS — Z8673 Personal history of transient ischemic attack (TIA), and cerebral infarction without residual deficits: Secondary | ICD-10-CM | POA: Diagnosis not present

## 2015-04-03 DIAGNOSIS — Z791 Long term (current) use of non-steroidal anti-inflammatories (NSAID): Secondary | ICD-10-CM | POA: Diagnosis not present

## 2015-04-03 DIAGNOSIS — Z7982 Long term (current) use of aspirin: Secondary | ICD-10-CM | POA: Insufficient documentation

## 2015-04-03 DIAGNOSIS — Z792 Long term (current) use of antibiotics: Secondary | ICD-10-CM | POA: Insufficient documentation

## 2015-04-03 DIAGNOSIS — Z8639 Personal history of other endocrine, nutritional and metabolic disease: Secondary | ICD-10-CM | POA: Diagnosis not present

## 2015-04-03 MED ORDER — PENICILLIN V POTASSIUM 250 MG PO TABS
500.0000 mg | ORAL_TABLET | Freq: Four times a day (QID) | ORAL | Status: DC
  Administered 2015-04-03: 500 mg via ORAL
  Filled 2015-04-03: qty 2

## 2015-04-03 MED ORDER — PENICILLIN V POTASSIUM 500 MG PO TABS: 500.0000 mg | ORAL_TABLET | Freq: Four times a day (QID) | ORAL | Status: AC

## 2015-04-03 NOTE — ED Notes (Signed)
Tracheal sounds clear, normal speech

## 2015-04-03 NOTE — ED Provider Notes (Signed)
CSN: [id number]     Arrival date & time 04/03/15  1616 History  By signing my name below, I, Iona Beard, attest that this documentation has been prepared under the direction and in the presence of Doug Sou, MD.   Electronically Signed: Iona Beard, ED Scribe. 04/03/2015. 6:08 PM   Chief Complaint  Patient presents with  . Facial Swelling   The history is provided by the patient. No language interpreter was used.   HPI Comments: Jesse Sanders is a 50 y.o. male with PMHx of horseshoe kidney, HLD, MI, stroke, and HTN who presents to the Emergency Department complaining of gradual onset, constant, left facial swelling, onset today around 12 PM.Today. Pt reports that he took an aleve tablet last night at 1 AM and woke up with a swollen face. He states pain with palpation of the area. He reports that he has had a similar reaction with aleve in the past. No other associated symptoms noted. No worsening or alleviating factors noted. Pt denies shortness of breath, trouble swallowing, denies fever. Pain is minimal at present and localized to left jaw. Pt is currently an everyday smoker and occasional drinker. Denies illicit substance use. Pt says he has an appointment with a dentist next month.   Past Medical History  Diagnosis Date  . Horseshoe kidney   . Hypertension   . High cholesterol   . Small vessel disease (HCC)   . Heart attack (HCC)   . Stroke [facility])    Past Surgical History  Procedure Laterality Date  . Leg surgery     No family history on file. Social History  Substance Use Topics  . Smoking status: Current Every Day Smoker  . Smokeless tobacco: Never Used  . Alcohol Use: No     Comment: last drink 3 years ago    Review of Systems  Constitutional: Negative.   HENT: Positive for dental problem.        Jaw pain  Respiratory: Negative.   Cardiovascular: Negative.   Gastrointestinal: Negative.   Musculoskeletal: Negative.   Skin: Negative.   Neurological:  Negative.   Psychiatric/Behavioral: Negative.   All other systems reviewed and are negative.  Allergies  Aleve  Home Medications   Prior to Admission medications   Medication Sig Start Date End Date Taking? Authorizing Provider  aspirin EC 81 MG tablet Take 81 mg by mouth daily.   Yes Historical Provider, MD  clindamycin (CLEOCIN) 300 MG capsule Take 1 capsule (300 mg total) by mouth 3 (three) times daily. 02/19/15   Linna Hoff, MD  ibuprofen (ADVIL,MOTRIN) 800 MG tablet Take 1 tablet (800 mg total) by mouth 3 (three) times daily. 02/19/15   Linna Hoff, MD   BP 156/101 mmHg  Pulse 88  Temp(Src) 99.1 F (37.3 C) (Oral)  Resp 16  SpO2 100% Physical Exam  Constitutional: He appears well-developed and well-nourished. No distress.  HENT:  Head: Normocephalic and atraumatic.  Right Ear: External ear normal.  Left Ear: External ear normal.  Teeth #25 and 26 are partially fractured and badly decayed. There is swelling and tenderness at the left mandible just lateral to the symphysis. No trismus. No tenderness or fluctuance of submandibular area. Tongue is normal. No fluctuance gingiva  Eyes: Conjunctivae are normal. Pupils are equal, round, and reactive to light.  Neck: Neck supple. No tracheal deviation present. No thyromegaly present.  Cardiovascular: Normal rate and regular rhythm.   No murmur heard. Pulmonary/Chest: Effort normal and breath sounds normal.  Abdominal: Soft. Bowel sounds are normal. He exhibits no distension. There is no tenderness.  Musculoskeletal: Normal range of motion. He exhibits no edema or tenderness.  Neurological: He is alert. Coordination normal.  Skin: Skin is warm and dry. No rash noted.  Psychiatric: He has a normal mood and affect.  Nursing note and vitals reviewed.   ED Course  Procedures (including critical care time) DIAGNOSTIC STUDIES: Oxygen Saturation is 100% on RA, normal by my interpretation.    COORDINATION OF CARE: 6:13  PM-Discussed treatment plan which includes penicillin with pt at bedside and pt agreed to plan.   Labs Review Labs Reviewed - No data to display  Imaging Review No results found. I have personally reviewed and evaluated these images and lab results as part of my medical decision-making.   EKG Interpretation None      MDM  I suspect the patient has dental abscess given his poor dentition and jaw tenderness and swelling. He is advised to avoid Aleve she's had similar episodes of in the past after taking Aleve however I don't feel that this is an allergic reaction. Patient has dental follow-up next month to not recall the date. He'll also be given referral to Dr. Norma FredricksonFarr list, dentist on call. Prescription Pen-Vee K. Blood pressure recheck 3 weeks Diagnoses #1 dental abscess #2 elevated blood pressure Final diagnoses:  None          Doug SouSam Tee Richeson, MD 04/03/15 1826

## 2015-04-03 NOTE — ED Notes (Signed)
States took two Aleve tablets last pm, states awoke today approx noon with rt lower facial swelling, denies any SOB, dysphagia or dysphonia, appears very comfortable at this time in waiting area, pt placed near Nurse First for constant observation

## 2015-04-03 NOTE — Discharge Instructions (Signed)
Dental Abscess Avoid Aleve. Instead take Tylenol for pain. Call your dentist tomorrow and ask if you can be seen sooner than your appointment next month. If your dentist cannot see you within a week, call Dr. Leanord AsalFarless to schedule next available office appointment and tell office staff that you were seen here when schedule the appointment. Your blood pressure was mildly elevated today at 155/93 and should be rechecked within the next 3 weeks at your doctor's office or at an urgent care center. A dental abscess is pus in or around a tooth. HOME CARE  Take medicines only as told by your dentist.  If you were prescribed antibiotic medicine, finish all of it even if you start to feel better.  Rinse your mouth (gargle) often with salt water.  Do not drive or use heavy machinery, like a lawn mower, while taking pain medicine.  Do not apply heat to the outside of your mouth.  Keep all follow-up visits as told by your dentist. This is important. GET HELP IF:  Your pain is worse, and medicine does not help. GET HELP RIGHT AWAY IF:  You have a fever or chills.  Your symptoms suddenly get worse.  You have a very bad headache.  You have problems breathing or swallowing.  You have trouble opening your mouth.  You have puffiness (swelling) in your neck or around your eye.   This information is not intended to replace advice given to you by your health care provider. Make sure you discuss any questions you have with your health care provider.   Document Released: 05/11/2014 Document Reviewed: 05/11/2014 Elsevier Interactive Patient Education [employer]2016 Elsevier Inc.

## 2015-04-04 MED FILL — PENICILLIN VK 500 MG TABLET: 500 | 10 days supply | Qty: 40 | Fill #0

## 2015-05-09 ENCOUNTER — Encounter: Payer: Self-pay | Admitting: Internal Medicine

## 2015-05-09 ENCOUNTER — Ambulatory Visit (INDEPENDENT_AMBULATORY_CARE_PROVIDER_SITE_OTHER): Payer: No Typology Code available for payment source | Admitting: Internal Medicine

## 2015-05-09 VITALS — BP 150/93 | HR 78 | Temp 98.3°F | Ht 71.0 in | Wt 177.8 lb

## 2015-05-09 DIAGNOSIS — Z8673 Personal history of transient ischemic attack (TIA), and cerebral infarction without residual deficits: Secondary | ICD-10-CM | POA: Insufficient documentation

## 2015-05-09 DIAGNOSIS — Q631 Lobulated, fused and horseshoe kidney: Secondary | ICD-10-CM | POA: Insufficient documentation

## 2015-05-09 DIAGNOSIS — M5412 Radiculopathy, cervical region: Secondary | ICD-10-CM | POA: Insufficient documentation

## 2015-05-09 DIAGNOSIS — I10 Essential (primary) hypertension: Secondary | ICD-10-CM | POA: Diagnosis not present

## 2015-05-09 DIAGNOSIS — M25511 Pain in right shoulder: Secondary | ICD-10-CM

## 2015-05-09 DIAGNOSIS — J309 Allergic rhinitis, unspecified: Secondary | ICD-10-CM

## 2015-05-09 DIAGNOSIS — J302 Other seasonal allergic rhinitis: Secondary | ICD-10-CM

## 2015-05-09 LAB — POCT GLYCOSYLATED HEMOGLOBIN (HGB A1C): Hemoglobin A1C: 5.5

## 2015-05-09 LAB — GLUCOSE, CAPILLARY: Glucose-Capillary: 84 mg/dL (ref 65–99)

## 2015-05-09 MED ORDER — MELOXICAM 10 MG PO CAPS: 10.0000 mg | ORAL_CAPSULE | Freq: Every day | ORAL | Status: DC | PRN

## 2015-05-09 MED ORDER — CETIRIZINE HCL 10 MG PO TABS: 10.0000 mg | ORAL_TABLET | Freq: Every day | ORAL | Status: DC

## 2015-05-09 MED ORDER — MOMETASONE FUROATE 50 MCG/ACT NA SUSP: NASAL | Status: DC

## 2015-05-09 MED ORDER — HYDROCHLOROTHIAZIDE 12.5 MG PO CAPS: 12.5000 mg | ORAL_CAPSULE | Freq: Every day | ORAL | Status: DC
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
## 2015-05-09 NOTE — Assessment & Plan Note (Signed)
Overview He also complains of sinus problems today. In [place], he reports feeling relief with cetirizine and Nasonex. Since he has been here, he has not found relief with over-the-counter remedies, like Sudafed, Tylenol sinus, Advil sinus. His symptoms consist mainly of itching, congestion, rhinorrhea though he denies postnasal drip when he wakes up or runny eyes.  Assessment Allergic rhinitis, likely seasonal.  Plan -Prescribed cetirizine 10 mg daily along with Nasonex 1-2 sprays per nostril daily

## 2015-05-09 NOTE — Patient Instructions (Addendum)
For allergies, please take Zyrtec daily and Nasonex.  For the shoulder pain, we will order an MRI to better assess what's going on. Please take meloxicam 10 mg daily for now.  For the blood pressure, please start taking HCTZ 12.5 mg daily.  We look forward to seeing you back in 4 weeks!

## 2015-05-09 NOTE — Assessment & Plan Note (Addendum)
Overview For the last month, he acknowledges the acute onset of right shoulder pain. He first noted pain while he was sleeping. His pain is worse when he is resting or inactive, like watching TV or attempting sleep at night. He scores the pain 7/10 though appears to improve throughout the day when he is active. He has not tried any over-the-counter medications for relief. About 10-12 years ago, he does acknowledge falling off a ladder at which time MRI imaging obtained was unremarkable for acute fracture or lesions.  Assessment Suspect cervical radiculopathy. I do not think he is having TIAs as the time course and occurrence would be inconsistent.  Plan -Prescribed meloxicam 10 mg daily -Ordered cervical MRI to assess for radiculopathy  ADDENDUM 06/07/2015  11:35 AM:  MRI 05/26/15 notable for right paracentral disc extrusion at C6/C7 with subsequent severe right-sided spinal stenosis and moderate right neural foraminal stenosis along with moderate spinal stenosis at C4/C5 with focal spinal cord myelopathic type signal change.  I have attempted to reach the patient to discuss results though no voicemail exists to leave a message.

## 2015-05-09 NOTE — Assessment & Plan Note (Addendum)
Overview He reports he had a prior history back in 2009 11 mini stroke. I was able to locate neuroimaging in the system which does not appear to suggest he had a massive cerebrovascular accident at that time. He denies any recurrent symptoms since that hospitalization. He does report adherence to daily aspirin 81 mg.  Assessment History of TIA. Risk factors for recurrence include poorly controlled blood pressure and ongoing tobacco use. Lipid status is unknown at this time.  Plan -Check lipid panel, A1c -Counseling tobacco use cessation at follow-up visit  ADDENDUM 05/10/2015  2:39 PM:  Per 2013 AHA/ACC ASCVD risk calculator, 10-year risk is 18.7 % and 3.5 % with optimal risk factors. High-intensity stain therapy is indicated, so we will start with atorvastatin 40 mg daily. I spoke with the patient by phone who is in agreement though is awaiting information from his insurance about coverage.  A1c 5.5 reassuring.

## 2015-05-09 NOTE — Assessment & Plan Note (Addendum)
Overview His blood pressure today is 150/93. He is not on any blood pressure medications currently and has never been told that he has elevated blood pressure. He denies any symptoms of hypertensive urgency, like headache, blurry vision, chest pain, difficulty breathing.  Assessment Essential hypertension, not at goal.  Plan -Check CMET -Prescribed HCTZ 12.5 mg to be taken daily with plan to up titrate to 25 mg if his blood pressure does not appear to be controlled at follow-up  ADDENDUM 05/10/2015  2:40 PM:  CBC, CMET reassuring for no abnormalities.

## 2015-05-10 LAB — CMP14 + ANION GAP
A/G RATIO: 1.6 (ref 1.2–2.2)
ALT: 43 IU/L (ref 0–44)
AST: 30 IU/L (ref 0–40)
Albumin: 4.3 g/dL (ref 3.5–5.5)
Alkaline Phosphatase: 56 IU/L (ref 39–117)
Anion Gap: 16 mmol/L (ref 10.0–18.0)
BUN/Creatinine Ratio: 8 — ABNORMAL LOW (ref 9–20)
BUN: 7 mg/dL (ref 6–24)
Bilirubin Total: 0.4 mg/dL (ref 0.0–1.2)
CHLORIDE: 100 mmol/L (ref 96–106)
CO2: 24 mmol/L (ref 18–29)
Calcium: 9.7 mg/dL (ref 8.7–10.2)
Creatinine, Ser: 0.9 mg/dL (ref 0.76–1.27)
GFR calc Af Amer: 116 mL/min/{1.73_m2} (ref >59)
GFR, EST NON AFRICAN AMERICAN: 101 mL/min/{1.73_m2} (ref >59)
GLUCOSE: 84 mg/dL (ref 65–99)
Globulin, Total: 2.7 g/dL (ref 1.5–4.5)
POTASSIUM: 3.9 mmol/L (ref 3.5–5.2)
Sodium: 140 mmol/L (ref 134–144)
Total Protein: 7 g/dL (ref 6.0–8.5)

## 2015-05-10 LAB — CBC WITH DIFFERENTIAL/PLATELET
BASOS ABS: 0.1 10*3/uL (ref 0.0–0.2)
BASOS: 1 %
EOS (ABSOLUTE): 0.1 10*3/uL (ref 0.0–0.4)
Eos: 2 %
Hematocrit: 42 % (ref 37.5–51.0)
Hemoglobin: 14.3 g/dL (ref 12.6–17.7)
IMMATURE GRANS (ABS): 0 10*3/uL (ref 0.0–0.1)
IMMATURE GRANULOCYTES: 0 %
LYMPHS: 35 %
Lymphocytes Absolute: 2.8 10*3/uL (ref 0.7–3.1)
MCH: 30.1 pg (ref 26.6–33.0)
MCHC: 34 g/dL (ref 31.5–35.7)
MCV: 88 fL (ref 79–97)
Monocytes Absolute: 0.7 10*3/uL (ref 0.1–0.9)
Monocytes: 8 %
NEUTROS PCT: 54 %
Neutrophils Absolute: 4.5 10*3/uL (ref 1.4–7.0)
PLATELETS: 230 10*3/uL (ref 150–379)
RBC: 4.75 x10E6/uL (ref 4.14–5.80)
RDW: 13.8 % (ref 12.3–15.4)
WBC: 8.2 10*3/uL (ref 3.4–10.8)

## 2015-05-10 LAB — LIPID PANEL
CHOLESTEROL TOTAL: 213 mg/dL — AB (ref 100–199)
Chol/HDL Ratio: 5 ratio units (ref 0.0–5.0)
HDL: 43 mg/dL (ref >39)
LDL Calculated: 151 mg/dL — ABNORMAL HIGH (ref 0–99)
Triglycerides: 95 mg/dL (ref 0–149)
VLDL CHOLESTEROL CAL: 19 mg/dL (ref 5–40)

## 2015-05-10 MED ORDER — ATORVASTATIN CALCIUM 40 MG PO TABS: 40.0000 mg | ORAL_TABLET | Freq: Every day | ORAL | Status: DC

## 2015-05-10 NOTE — Addendum Note (Signed)
Addended by: Beather ArbourPATEL, RUSHIL V on: 05/10/2015 02:42 PM   Modules accepted: Orders, SmartSet

## 2015-05-11 NOTE — Progress Notes (Signed)
Internal Medicine Clinic Attending  Case discussed with Dr. Patel,Rushil at the time of the visit.  We reviewed the resident's history and exam and pertinent patient test results.  I agree with the assessment, diagnosis, and plan of care documented in the resident's note.  

## 2015-05-26 ENCOUNTER — Ambulatory Visit (HOSPITAL_COMMUNITY)
Admission: RE | Admit: 2015-05-26 | Discharge: 2015-05-26 | Disposition: A | Discharge status: Home / Self Care | Payer: No Typology Code available for payment source | Source: Ambulatory Visit | Attending: Internal Medicine | Admitting: Internal Medicine

## 2015-05-26 DIAGNOSIS — M50222 Other cervical disc displacement at C5-C6 level: Secondary | ICD-10-CM | POA: Insufficient documentation

## 2015-05-26 DIAGNOSIS — M50221 Other cervical disc displacement at C4-C5 level: Secondary | ICD-10-CM | POA: Insufficient documentation

## 2015-05-26 DIAGNOSIS — M50223 Other cervical disc displacement at C6-C7 level: Secondary | ICD-10-CM | POA: Insufficient documentation

## 2015-05-26 DIAGNOSIS — Q675 Congenital deformity of spine: Secondary | ICD-10-CM | POA: Diagnosis not present

## 2015-05-26 DIAGNOSIS — M4802 Spinal stenosis, cervical region: Secondary | ICD-10-CM | POA: Diagnosis not present

## 2015-05-26 DIAGNOSIS — M25511 Pain in right shoulder: Secondary | ICD-10-CM | POA: Insufficient documentation

## 2015-05-27 NOTE — Progress Notes (Signed)
Patient had not picked up atorvastatin or meloxicam. Per [facility] pharmacy, total for both > $100, coupon cards faxed to pharmacy.

## 2015-06-07 NOTE — Addendum Note (Signed)
Addended by: Beather ArbourPATEL, Amaryah Mallen V on: 06/07/2015 11:36 AM   Modules accepted: SmartSet

## 2015-07-13 ENCOUNTER — Other Ambulatory Visit: Payer: Self-pay | Admitting: *Deleted

## 2015-07-13 DIAGNOSIS — I10 Essential (primary) hypertension: Secondary | ICD-10-CM

## 2015-07-13 DIAGNOSIS — J302 Other seasonal allergic rhinitis: Secondary | ICD-10-CM

## 2015-07-13 MED ORDER — HYDROCHLOROTHIAZIDE 12.5 MG PO CAPS: 12.5000 mg | ORAL_CAPSULE | Freq: Every day | ORAL | Status: DC

## 2015-07-13 MED ORDER — CETIRIZINE HCL 10 MG PO TABS: 10.0000 mg | ORAL_TABLET | Freq: Every day | ORAL | Status: DC

## 2015-07-13 NOTE — Telephone Encounter (Signed)
[facility]

## 2015-07-13 NOTE — Telephone Encounter (Signed)
Requesting 90 day supply.

## 2016-03-05 ENCOUNTER — Encounter (HOSPITAL_BASED_OUTPATIENT_CLINIC_OR_DEPARTMENT_OTHER): Payer: Self-pay | Admitting: *Deleted

## 2016-03-05 ENCOUNTER — Emergency Department (HOSPITAL_BASED_OUTPATIENT_CLINIC_OR_DEPARTMENT_OTHER)
Admission: EM | Admit: 2016-03-05 | Discharge: 2016-03-05 | Disposition: A | Discharge status: Home / Self Care | Payer: No Typology Code available for payment source | Attending: Emergency Medicine | Admitting: Emergency Medicine

## 2016-03-05 DIAGNOSIS — Z7982 Long term (current) use of aspirin: Secondary | ICD-10-CM | POA: Diagnosis not present

## 2016-03-05 DIAGNOSIS — I10 Essential (primary) hypertension: Secondary | ICD-10-CM | POA: Insufficient documentation

## 2016-03-05 DIAGNOSIS — F17210 Nicotine dependence, cigarettes, uncomplicated: Secondary | ICD-10-CM | POA: Diagnosis not present

## 2016-03-05 DIAGNOSIS — N12 Tubulo-interstitial nephritis, not specified as acute or chronic: Secondary | ICD-10-CM | POA: Diagnosis not present

## 2016-03-05 DIAGNOSIS — R109 Unspecified abdominal pain: Secondary | ICD-10-CM | POA: Diagnosis present

## 2016-03-05 DIAGNOSIS — Z79899 Other long term (current) drug therapy: Secondary | ICD-10-CM | POA: Diagnosis not present

## 2016-03-05 LAB — COMPREHENSIVE METABOLIC PANEL
ALT: 24 U/L (ref 17–63)
AST: 27 U/L (ref 15–41)
Albumin: 4.5 g/dL (ref 3.5–5.0)
Alkaline Phosphatase: 47 U/L (ref 38–126)
Anion gap: 7 (ref 5–15)
BILIRUBIN TOTAL: 0.6 mg/dL (ref 0.3–1.2)
BUN: 11 mg/dL (ref 6–20)
CALCIUM: 9.7 mg/dL (ref 8.9–10.3)
CO2: 26 mmol/L (ref 22–32)
CREATININE: 0.88 mg/dL (ref 0.61–1.24)
Chloride: 104 mmol/L (ref 101–111)
GFR calc non Af Amer: >60 mL/min (ref >60)
Glucose, Bld: 87 mg/dL (ref 65–99)
Potassium: 3.9 mmol/L (ref 3.5–5.1)
Sodium: 137 mmol/L (ref 135–145)
TOTAL PROTEIN: 8.1 g/dL (ref 6.5–8.1)

## 2016-03-05 LAB — CBC WITH DIFFERENTIAL/PLATELET
BASOS ABS: 0 10*3/uL (ref 0.0–0.1)
Basophils Relative: 0 %
EOS PCT: 1 %
Eosinophils Absolute: 0.1 10*3/uL (ref 0.0–0.7)
HEMATOCRIT: 44.8 % (ref 39.0–52.0)
Hemoglobin: 15.3 g/dL (ref 13.0–17.0)
LYMPHS ABS: 2.7 10*3/uL (ref 0.7–4.0)
Lymphocytes Relative: 30 %
MCH: 30.2 pg (ref 26.0–34.0)
MCHC: 34.2 g/dL (ref 30.0–36.0)
MCV: 88.5 fL (ref 78.0–100.0)
MONO ABS: 0.8 10*3/uL (ref 0.1–1.0)
Monocytes Relative: 9 %
NEUTROS ABS: 5.3 10*3/uL (ref 1.7–7.7)
Neutrophils Relative %: 60 %
Platelets: 256 10*3/uL (ref 150–400)
RBC: 5.06 MIL/uL (ref 4.22–5.81)
RDW: 14.3 % (ref 11.5–15.5)
WBC: 9 10*3/uL (ref 4.0–10.5)

## 2016-03-05 LAB — URINALYSIS, ROUTINE W REFLEX MICROSCOPIC
Bilirubin Urine: NEGATIVE
Glucose, UA: NEGATIVE mg/dL
HGB URINE DIPSTICK: NEGATIVE
KETONES UR: NEGATIVE mg/dL
NITRITE: NEGATIVE
Protein, ur: NEGATIVE mg/dL
SPECIFIC GRAVITY, URINE: 1.014 (ref 1.005–1.030)
pH: 5.5 (ref 5.0–8.0)

## 2016-03-05 LAB — URINALYSIS, MICROSCOPIC (REFLEX): RBC / HPF: NONE SEEN RBC/hpf (ref 0–5)

## 2016-03-05 LAB — LIPASE, BLOOD: Lipase: 63 U/L — ABNORMAL HIGH (ref 11–51)

## 2016-03-05 MED ORDER — CEPHALEXIN 500 MG PO CAPS: 500.0000 mg | ORAL_CAPSULE | Freq: Four times a day (QID) | ORAL | 0 refills | Status: DC

## 2016-03-05 MED ORDER — DEXTROSE 5 % IV SOLN
1.0000 g | Freq: Once | INTRAVENOUS | Status: AC
  Administered 2016-03-05: 1 g via INTRAVENOUS
  Filled 2016-03-05: qty 10

## 2016-03-05 NOTE — ED Notes (Signed)
Nursing Student. Obtaining IV.
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
## 2016-03-05 NOTE — ED Notes (Signed)
Pt verbalized understanding of discharge instructions and denies any further questions at this time.   

## 2016-03-05 NOTE — ED Notes (Signed)
ED Provider at bedside. 

## 2016-03-05 NOTE — ED Triage Notes (Signed)
Right flank pain x 2 weeks. Pain is constant. Worse with movement.

## 2016-03-05 NOTE — Discharge Instructions (Signed)
Take antibiotics as prescribed until all gone. Take Tylenol for pain. Please follow-up with your doctor for recheck. Return if worsening symptoms, nausea, vomiting, high fever.

## 2016-03-06 LAB — URINE CULTURE: Culture: NO GROWTH

## 2016-03-15 ENCOUNTER — Ambulatory Visit (INDEPENDENT_AMBULATORY_CARE_PROVIDER_SITE_OTHER): Payer: PRIVATE HEALTH INSURANCE | Admitting: Internal Medicine

## 2016-03-15 VITALS — BP 141/91 | HR 79 | Temp 98.1°F | Ht 71.0 in | Wt 177.4 lb

## 2016-03-15 DIAGNOSIS — Z9114 Patient's other noncompliance with medication regimen: Secondary | ICD-10-CM

## 2016-03-15 DIAGNOSIS — I10 Essential (primary) hypertension: Secondary | ICD-10-CM

## 2016-03-15 DIAGNOSIS — J302 Other seasonal allergic rhinitis: Secondary | ICD-10-CM

## 2016-03-15 DIAGNOSIS — Z8673 Personal history of transient ischemic attack (TIA), and cerebral infarction without residual deficits: Secondary | ICD-10-CM | POA: Diagnosis not present

## 2016-03-15 DIAGNOSIS — R109 Unspecified abdominal pain: Secondary | ICD-10-CM

## 2016-03-15 DIAGNOSIS — J309 Allergic rhinitis, unspecified: Secondary | ICD-10-CM

## 2016-03-15 DIAGNOSIS — F17210 Nicotine dependence, cigarettes, uncomplicated: Secondary | ICD-10-CM

## 2016-03-15 MED ORDER — SALINE SPRAY 0.65 % NA SOLN: 1.0000 | NASAL | 11 refills | Status: DC | PRN

## 2016-03-15 MED ORDER — CETIRIZINE HCL 10 MG PO TABS: 10.0000 mg | ORAL_TABLET | Freq: Every day | ORAL | 1 refills | Status: DC

## 2016-03-15 MED ORDER — ATORVASTATIN CALCIUM 40 MG PO TABS: 40.0000 mg | ORAL_TABLET | Freq: Every day | ORAL | 6 refills | Status: DC

## 2016-03-15 MED ORDER — FLUTICASONE PROPIONATE 50 MCG/ACT NA SUSP: 1.0000 | Freq: Every day | NASAL | 2 refills | Status: DC

## 2016-03-15 MED ORDER — HYDROCHLOROTHIAZIDE 12.5 MG PO CAPS: 12.5000 mg | ORAL_CAPSULE | Freq: Every day | ORAL | 4 refills | Status: DC

## 2016-03-15 NOTE — Progress Notes (Signed)
Medicine attending: Medical history, presenting problems, physical findings, and medications, reviewed with resident physician Dr Diana Truong on the day of the patient visit and I concur with her evaluation and management plan. 

## 2016-03-15 NOTE — Assessment & Plan Note (Signed)
Assessment: Patient was seen in the ED for flank pain on February 26. He was given a dose of Rocephin and sent home with Keflex. He has since finished his course of Keflex. Urine culture negative from emergency department. He denies fevers and dysuria. His flank pain is still present however has improved. Flank pain was present 2 weeks for 4 ED presentation and that was his presenting complaint. 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  Plan: Likely bilateral flank pain is musculoskeletal. Continue to monitor

## 2016-03-15 NOTE — Progress Notes (Signed)
   CC: flank pain f/u  HPI:  Mr.Daysen Dady is a 51 y.o. with past medical history as outlined below who presents to clinic for flank pain follow-up. Please see problem list for further details.   Past Medical History:  Diagnosis Date  . High cholesterol   . Horseshoe kidney   . Hypertension   . Small vessel disease   . Stroke [facility](HCC)     Review of Systems:  Denies dysuria, fevers, night sweats, chills. Positive for chronic sinusitis. Flank pain still present and has progressively improved over the past 4 weeks.  Physical Exam:  Vitals:   03/15/16 1107  BP: (!) 141/91  Pulse: 79  Temp: 98.1 F (36.7 C)  TempSrc: Oral  SpO2: 100%  Weight: 177 lb 6.4 oz (80.5 kg)  Height: 5\' 11"  (1.803 m)   Physical Exam  Constitutional: oriented to person, place, and time. appears well-developed and well-nourished. No distress.  HENT:  Head: Normocephalic and atraumatic.  Nose: Nose normal.  Cardiovascular: Normal rate, regular rhythm and normal heart sounds.  Exam reveals no gallop and no friction rub.   No murmur heard. Pulmonary/Chest: Effort normal and breath sounds normal. No respiratory distress.  has no wheezes.no rales.  Abdominal: Soft. Bowel sounds are normal.  exhibits no distension. There is no tenderness. There is no rebound and no guarding.  Neurological: alert and oriented to person, place, and time.  Skin: Skin is warm and dry. No rash noted.  not diaphoretic. No erythema. No pallor.  MSK: TTP of b/l flank areas, neg for spinal tenderness  Assessment & Plan:   See Encounters Tab for problem based charting.  Patient discussed with Dr. Cyndie ChimeGranfortuna

## 2016-03-15 NOTE — Assessment & Plan Note (Signed)
Assessment: Patient has chronic sinusitis. He has not been using his nasal steroid or a daily antihistamine.  Plan: Refilled Zyrtec. Rx sent for Flonase and nasal spray.

## 2016-03-15 NOTE — Assessment & Plan Note (Signed)
Assessment: Blood pressure elevated. He has not been taking his hydrochlorothiazide 12.5 mg daily.  Plan: Refilled hydrochlorothiazide 12.5 mg daily. Has follow-up in June with PCP.

## 2016-03-15 NOTE — Assessment & Plan Note (Signed)
Assessment: Patient has history of TIA and CVA. He has not been taking his statin. Plan: Refilled statin.

## 2016-06-01 ENCOUNTER — Encounter (HOSPITAL_BASED_OUTPATIENT_CLINIC_OR_DEPARTMENT_OTHER): Payer: Self-pay

## 2016-06-01 ENCOUNTER — Emergency Department (HOSPITAL_BASED_OUTPATIENT_CLINIC_OR_DEPARTMENT_OTHER)
Admission: EM | Admit: 2016-06-01 | Discharge: 2016-06-01 | Disposition: A | Discharge status: Home / Self Care | Payer: PRIVATE HEALTH INSURANCE | Attending: Emergency Medicine | Admitting: Emergency Medicine

## 2016-06-01 DIAGNOSIS — Z7982 Long term (current) use of aspirin: Secondary | ICD-10-CM | POA: Insufficient documentation

## 2016-06-01 DIAGNOSIS — W010XXA Fall on same level from slipping, tripping and stumbling without subsequent striking against object, initial encounter: Secondary | ICD-10-CM | POA: Insufficient documentation

## 2016-06-01 DIAGNOSIS — Z79899 Other long term (current) drug therapy: Secondary | ICD-10-CM | POA: Insufficient documentation

## 2016-06-01 DIAGNOSIS — G459 Transient cerebral ischemic attack, unspecified: Secondary | ICD-10-CM | POA: Insufficient documentation

## 2016-06-01 DIAGNOSIS — F17210 Nicotine dependence, cigarettes, uncomplicated: Secondary | ICD-10-CM | POA: Insufficient documentation

## 2016-06-01 DIAGNOSIS — M62830 Muscle spasm of back: Secondary | ICD-10-CM | POA: Insufficient documentation

## 2016-06-01 DIAGNOSIS — Y998 Other external cause status: Secondary | ICD-10-CM | POA: Insufficient documentation

## 2016-06-01 DIAGNOSIS — Y929 Unspecified place or not applicable: Secondary | ICD-10-CM | POA: Insufficient documentation

## 2016-06-01 DIAGNOSIS — I10 Essential (primary) hypertension: Secondary | ICD-10-CM | POA: Insufficient documentation

## 2016-06-01 DIAGNOSIS — Y9301 Activity, walking, marching and hiking: Secondary | ICD-10-CM | POA: Insufficient documentation

## 2016-06-01 MED ORDER — METHOCARBAMOL 500 MG PO TABS: 1000.0000 mg | ORAL_TABLET | Freq: Three times a day (TID) | ORAL | 0 refills | Status: DC | PRN

## 2016-06-01 MED ORDER — METHOCARBAMOL 500 MG PO TABS: 1000.0000 mg | ORAL_TABLET | Freq: Once | ORAL | Status: DC
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
## 2016-06-01 NOTE — ED Triage Notes (Signed)
C/o slipped/fell 3 days ago-pain to neck and both shoulders x today-NAD-steady gait

## 2016-06-20 ENCOUNTER — Encounter: Payer: PRIVATE HEALTH INSURANCE | Admitting: Internal Medicine

## 2016-07-18 ENCOUNTER — Encounter: Payer: Self-pay | Admitting: Internal Medicine

## 2016-10-10 ENCOUNTER — Encounter: Payer: No Typology Code available for payment source | Admitting: Internal Medicine

## 2016-10-10 ENCOUNTER — Encounter: Payer: Self-pay | Admitting: Internal Medicine

## 2016-10-10 ENCOUNTER — Encounter (INDEPENDENT_AMBULATORY_CARE_PROVIDER_SITE_OTHER): Payer: Self-pay

## 2016-10-10 ENCOUNTER — Ambulatory Visit (INDEPENDENT_AMBULATORY_CARE_PROVIDER_SITE_OTHER): Payer: Self-pay | Admitting: Internal Medicine

## 2016-10-10 VITALS — BP 147/81 | HR 64 | Temp 98.0°F | Ht 71.0 in | Wt 171.8 lb

## 2016-10-10 DIAGNOSIS — E785 Hyperlipidemia, unspecified: Secondary | ICD-10-CM | POA: Insufficient documentation

## 2016-10-10 DIAGNOSIS — Z0000 Encounter for general adult medical examination without abnormal findings: Secondary | ICD-10-CM

## 2016-10-10 DIAGNOSIS — I10 Essential (primary) hypertension: Secondary | ICD-10-CM

## 2016-10-10 DIAGNOSIS — H9203 Otalgia, bilateral: Secondary | ICD-10-CM

## 2016-10-10 MED ORDER — ASPIRIN EC 81 MG PO TBEC: 81.0000 mg | DELAYED_RELEASE_TABLET | Freq: Every day | ORAL | 3 refills | Status: AC

## 2016-10-10 MED ORDER — HYDROCHLOROTHIAZIDE 12.5 MG PO CAPS: 12.5000 mg | ORAL_CAPSULE | Freq: Every day | ORAL | 3 refills | Status: DC

## 2016-10-10 MED ORDER — ATORVASTATIN CALCIUM 40 MG PO TABS: 40.0000 mg | ORAL_TABLET | Freq: Every day | ORAL | 3 refills | Status: DC
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
## 2016-10-10 NOTE — Assessment & Plan Note (Signed)
Lipid Panel     Component Value Date/Time   CHOL 213 (H) 05/09/2015 1619   TRIG 95 05/09/2015 1619   HDL 43 05/09/2015 1619   CHOLHDL 5.0 05/09/2015 1619   CHOLHDL 5.5 10/12/2008 0645   VLDL 30 10/12/2008 0645   LDLCALC 151 (H) 05/09/2015 1619   Patient with history of suspected TIA and hyperlipidemia. Has been on high-intensity statin which is still indicated. Will refill Atorvastatin 40 mg daily.

## 2016-10-10 NOTE — Assessment & Plan Note (Signed)
Patient declined flu shot today, will think about. Information given in AVS. We discussed screening for colorectal cancer as he will be turning 51 soon. He will also think about this.

## 2016-10-10 NOTE — Assessment & Plan Note (Signed)
Patient with 2 weeks b/l "needle-like" ear pain and feeling of "wet" sensation. On my exam there is mild crusty cerumen at the outer ear canal, otherwise both ear canals are without excoriation, swelling, drainage, or bleeding. Bilateral TMs are intact without perforation or obvious fluid or bulging. Not clear what is causing his symptoms, but exam is reassuring. He is advised to call us if symptoms worsen and to complete paperwork to obtain the orange card at which time we can refer to ENT if needed. - Continue to monitor

## 2016-10-10 NOTE — Progress Notes (Signed)
Internal Medicine Clinic Attending  Case discussed with Dr. Patel at the time of the visit.  We reviewed the resident's history and exam and pertinent patient test results.  I agree with the assessment, diagnosis, and plan of care documented in the resident's note.  

## 2016-10-10 NOTE — Patient Instructions (Signed)
It was a pleasure to meet you Jesse Sanders.  Your ears both look good on my exam. I am not sure what is causing your symptoms, but otherwise exam appears to be reassuring.  Please call us if your pain worsens or you have decreased hearing. Please complete the paperwork to obtain the orange card at which point we can send you to a specialist if needed.  You can try over the counter antihistamines like Zyrtec, Claritin, Allegra, or Xyzal for allergies as well as nasal sprays and saline irrigation.  I have refilled your Atorvastatin and HCTZ.  Please consider obtaining the flu shot and think about the screening colonoscopy after you turn 51 years of age.  Please follow up with me in 6 months or sooner if needed.   Influenza Virus Vaccine (Flucelvax) What is this medicine? INFLUENZA VIRUS VACCINE (in floo EN zuh VAHY ruhs vak SEEN) helps to reduce the risk of getting influenza also known as the flu. The vaccine only helps protect you against some strains of the flu. This medicine may be used for other purposes; ask your health care provider or pharmacist if you have questions. COMMON BRAND NAME(S): FLUCELVAX What should I tell my health care provider before I take this medicine? They need to know if you have any of these conditions: -bleeding disorder like hemophilia -fever or infection -Guillain-Barre syndrome or other neurological problems -immune system problems -infection with the human immunodeficiency virus (HIV) or AIDS -low blood platelet counts -multiple sclerosis -an unusual or allergic reaction to influenza virus vaccine, other medicines, foods, dyes or preservatives -pregnant or trying to get pregnant -breast-feeding How should I use this medicine? This vaccine is for injection into a muscle. It is given by a health care professional. A copy of Vaccine Information Statements will be given before each vaccination. Read this sheet carefully each time. The sheet may change  frequently. Talk to your pediatrician regarding the use of this medicine in children. Special care may be needed. Overdosage: If you think you've taken too much of this medicine contact a poison control center or emergency room at once. Overdosage: If you think you have taken too much of this medicine contact a poison control center or emergency room at once. NOTE: This medicine is only for you. Do not share this medicine with others. What if I miss a dose? This does not apply. What may interact with this medicine? -chemotherapy or radiation therapy -medicines that lower your immune system like etanercept, anakinra, infliximab, and adalimumab -medicines that treat or prevent blood clots like warfarin -phenytoin -steroid medicines like prednisone or cortisone -theophylline -vaccines This list may not describe all possible interactions. Give your health care provider a list of all the medicines, herbs, non-prescription drugs, or dietary supplements you use. Also tell them if you smoke, drink alcohol, or use illegal drugs. Some items may interact with your medicine. What should I watch for while using this medicine? Report any side effects that do not go away within 3 days to your doctor or health care professional. Call your health care provider if any unusual symptoms occur within 6 weeks of receiving this vaccine. You may still catch the flu, but the illness is not usually as bad. You cannot get the flu from the vaccine. The vaccine will not protect against colds or other illnesses that may cause fever. The vaccine is needed every year. What side effects may I notice from receiving this medicine? Side effects that you should report to your  doctor or health care professional as soon as possible: -allergic reactions like skin rash, itching or hives, swelling of the face, lips, or tongue Side effects that usually do not require medical attention (Report these to your doctor or health care  professional if they continue or are bothersome.): -fever -headache -muscle aches and pains -pain, tenderness, redness, or swelling at the injection site -tiredness This list may not describe all possible side effects. Call your doctor for medical advice about side effects. You may report side effects to FDA at 1-800-FDA-1088. Where should I keep my medicine? The vaccine will be given by a health care professional in a clinic, pharmacy, doctor's office, or other health care setting. You will not be given vaccine doses to store at home. NOTE: This sheet is a summary. It may not cover all possible information. If you have questions about this medicine, talk to your doctor, pharmacist, or health care provider.  2018 Elsevier/Gold Standard (2010-12-06 14:06:47)

## 2016-10-10 NOTE — Assessment & Plan Note (Signed)
BP Readings from Last 3 Encounters:  10/10/16 (!) 147/81  06/01/16 (!) 144/95  03/15/16 (!) 141/91   BP mildly elevated while off his antihypertensive medication. Will refill current dose of HCTZ 12.5 mg daily with room to increase if needed and/or add on a CCB or ACE-I/ARB. - Refilled HCTZ 12.5 mg daily

## 2016-10-16 ENCOUNTER — Ambulatory Visit: Payer: No Typology Code available for payment source

## 2016-10-22 ENCOUNTER — Ambulatory Visit: Payer: Self-pay

## 2016-10-29 ENCOUNTER — Ambulatory Visit: Payer: Self-pay

## 2017-07-16 ENCOUNTER — Encounter (HOSPITAL_COMMUNITY): Payer: Self-pay

## 2017-07-16 ENCOUNTER — Observation Stay (HOSPITAL_COMMUNITY)
Admission: EM | Admit: 2017-07-16 | Discharge: 2017-07-17 | Disposition: A | Discharge status: Home / Self Care | Payer: BLUE CROSS/BLUE SHIELD | Attending: Internal Medicine | Admitting: Internal Medicine

## 2017-07-16 ENCOUNTER — Emergency Department (HOSPITAL_COMMUNITY): Payer: BLUE CROSS/BLUE SHIELD

## 2017-07-16 ENCOUNTER — Other Ambulatory Visit: Payer: Self-pay

## 2017-07-16 DIAGNOSIS — Z8673 Personal history of transient ischemic attack (TIA), and cerebral infarction without residual deficits: Secondary | ICD-10-CM | POA: Diagnosis not present

## 2017-07-16 DIAGNOSIS — R079 Chest pain, unspecified: Secondary | ICD-10-CM | POA: Diagnosis not present

## 2017-07-16 DIAGNOSIS — F17200 Nicotine dependence, unspecified, uncomplicated: Secondary | ICD-10-CM

## 2017-07-16 DIAGNOSIS — I208 Other forms of angina pectoris: Secondary | ICD-10-CM | POA: Diagnosis present

## 2017-07-16 DIAGNOSIS — M549 Dorsalgia, unspecified: Secondary | ICD-10-CM | POA: Diagnosis not present

## 2017-07-16 DIAGNOSIS — I10 Essential (primary) hypertension: Secondary | ICD-10-CM | POA: Insufficient documentation

## 2017-07-16 DIAGNOSIS — F17210 Nicotine dependence, cigarettes, uncomplicated: Secondary | ICD-10-CM | POA: Insufficient documentation

## 2017-07-16 DIAGNOSIS — Z720 Tobacco use: Secondary | ICD-10-CM

## 2017-07-16 DIAGNOSIS — I200 Unstable angina: Secondary | ICD-10-CM | POA: Diagnosis not present

## 2017-07-16 DIAGNOSIS — Z9119 Patient's noncompliance with other medical treatment and regimen: Secondary | ICD-10-CM

## 2017-07-16 DIAGNOSIS — I2089 Other forms of angina pectoris: Secondary | ICD-10-CM | POA: Diagnosis present

## 2017-07-16 DIAGNOSIS — Z7982 Long term (current) use of aspirin: Secondary | ICD-10-CM | POA: Insufficient documentation

## 2017-07-16 DIAGNOSIS — Z91199 Patient's noncompliance with other medical treatment and regimen due to unspecified reason: Secondary | ICD-10-CM

## 2017-07-16 HISTORY — DX: Headache, unspecified: R51.9

## 2017-07-16 HISTORY — DX: Headache: R51

## 2017-07-16 HISTORY — DX: Acute myocardial infarction, unspecified: I21.9

## 2017-07-16 LAB — CBC
HEMATOCRIT: 43.8 % (ref 39.0–52.0)
HEMATOCRIT: 46.5 % (ref 39.0–52.0)
HEMOGLOBIN: 13.9 g/dL (ref 13.0–17.0)
HEMOGLOBIN: 14.9 g/dL (ref 13.0–17.0)
MCH: 29.4 pg (ref 26.0–34.0)
MCH: 29.4 pg (ref 26.0–34.0)
MCHC: 31.7 g/dL (ref 30.0–36.0)
MCHC: 32 g/dL (ref 30.0–36.0)
MCV: 92.8 fL (ref 78.0–100.0)
MCV: 91.9 fL (ref 78.0–100.0)
Platelets: 225 10*3/uL (ref 150–400)
Platelets: 254 10*3/uL (ref 150–400)
RBC: 4.72 MIL/uL (ref 4.22–5.81)
RBC: 5.06 MIL/uL (ref 4.22–5.81)
RDW: 13.9 % (ref 11.5–15.5)
RDW: 14 % (ref 11.5–15.5)
WBC: 7.9 10*3/uL (ref 4.0–10.5)
WBC: 9.5 10*3/uL (ref 4.0–10.5)

## 2017-07-16 LAB — I-STAT TROPONIN, ED: Troponin i, poc: 0.01 ng/mL (ref 0.00–0.08)

## 2017-07-16 LAB — BASIC METABOLIC PANEL
ANION GAP: 9 (ref 5–15)
BUN: 11 mg/dL (ref 6–20)
CO2: 21 mmol/L — ABNORMAL LOW (ref 22–32)
Calcium: 9.2 mg/dL (ref 8.9–10.3)
Chloride: 109 mmol/L (ref 98–111)
Creatinine, Ser: 0.95 mg/dL (ref 0.61–1.24)
GFR calc Af Amer: >60 mL/min (ref >60)
GLUCOSE: 102 mg/dL — AB (ref 70–99)
POTASSIUM: 4.2 mmol/L (ref 3.5–5.1)
Sodium: 139 mmol/L (ref 135–145)

## 2017-07-16 LAB — CREATININE, SERUM
Creatinine, Ser: 1 mg/dL (ref 0.61–1.24)
GFR calc Af Amer: >60 mL/min (ref >60)
GFR calc non Af Amer: >60 mL/min (ref >60)

## 2017-07-16 LAB — TROPONIN I

## 2017-07-16 MED ORDER — ONDANSETRON HCL 4 MG PO TABS: 4.0000 mg | ORAL_TABLET | Freq: Four times a day (QID) | ORAL | Status: DC | PRN

## 2017-07-16 MED ORDER — ONDANSETRON HCL 4 MG/2ML IJ SOLN: 4.0000 mg | Freq: Four times a day (QID) | INTRAMUSCULAR | Status: DC | PRN

## 2017-07-16 MED ORDER — HYDROCHLOROTHIAZIDE 12.5 MG PO CAPS
12.5000 mg | ORAL_CAPSULE | Freq: Every day | ORAL | Status: DC
  Administered 2017-07-17: 12.5 mg via ORAL
  Filled 2017-07-16: qty 1

## 2017-07-16 MED ORDER — ACETAMINOPHEN 650 MG RE SUPP: 650.0000 mg | Freq: Four times a day (QID) | RECTAL | Status: DC | PRN

## 2017-07-16 MED ORDER — SODIUM CHLORIDE 0.9% FLUSH
3.0000 mL | Freq: Two times a day (BID) | INTRAVENOUS | Status: DC
  Administered 2017-07-16 – 2017-07-17 (×2): 3 mL via INTRAVENOUS

## 2017-07-16 MED ORDER — POLYETHYLENE GLYCOL 3350 17 G PO PACK: 17.0000 g | PACK | Freq: Every day | ORAL | Status: DC | PRN

## 2017-07-16 MED ORDER — ENOXAPARIN SODIUM 40 MG/0.4ML ~~LOC~~ SOLN
40.0000 mg | SUBCUTANEOUS | Status: DC
  Administered 2017-07-16: 40 mg via SUBCUTANEOUS
  Filled 2017-07-16: qty 0.4

## 2017-07-16 MED ORDER — NITROGLYCERIN 0.4 MG SL SUBL: 0.4000 mg | SUBLINGUAL_TABLET | SUBLINGUAL | Status: DC | PRN

## 2017-07-16 MED ORDER — ACETAMINOPHEN 325 MG PO TABS
650.0000 mg | ORAL_TABLET | Freq: Four times a day (QID) | ORAL | Status: DC | PRN
  Filled 2017-07-16: qty 2

## 2017-07-16 MED ORDER — ATORVASTATIN CALCIUM 40 MG PO TABS
40.0000 mg | ORAL_TABLET | Freq: Every day | ORAL | Status: DC
  Administered 2017-07-17: 40 mg via ORAL
  Filled 2017-07-16: qty 1

## 2017-07-16 MED ORDER — ASPIRIN EC 81 MG PO TBEC
81.0000 mg | DELAYED_RELEASE_TABLET | Freq: Every day | ORAL | Status: DC
  Administered 2017-07-17: 81 mg via ORAL
  Filled 2017-07-16: qty 1
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
## 2017-07-16 NOTE — H&P (Signed)
Date: 07/16/2017               Patient Name:  Jesse Sanders MRN: [id number]  DOB: [birth year] Age / Sex: 52 y.o., male   PCP: Synetta ShadowPrince, Jamie M, MD         Medical Service: Internal Medicine Teaching Service         Attending Physician: Dr. Doneen PoissonKlima, Lawrence, MD    First Contact: Dr. Teola BradleyMasoudi, Elham Pager: [phone]  Second Contact: Dr. Arnetha CourserAmin, Sumayya Pager: [phone]       After Hours (After 5p/  First Contact Pager: [phone]  weekends / holidays): Second Contact Pager: [phone]   Chief Complaint: Chest pain  History of Present Illness: 52 year-old male with past medical Hx. Of CAD, stroke and horseshoe kidney presented to ED with chest pain.  He has had dull pressure like chest pain since yesterday, with intensity of 10/10 att his mid chest radiated to his right shoulder and arm. No diaphoresis. No palpitation or shortness of breath is reported. The pain was consistent.  He was walking most of the day and not sure if activity or resting changed his pain. Did not do any thing to relieve the pain. He was able to sleep last night but this morning, he had the severe chest pain associated with nausea.   This morning he called ambulance. His chest pain resolved with sublingual NG but still has shoulder pain with intensity of 4/10.  He states that he has diagnosed with borderline HTN and HLD but is not taking any medications except ASA.  He reports history of chest pain with exersion but did not pay attention to that.  Code status: DNR  Meds:  Current Meds  Medication Sig  . aspirin EC 81 MG tablet Take 1 tablet (81 mg total) by mouth daily.     Allergies: Allergies as of 07/16/2017 - Review Complete 07/16/2017  Allergen Reaction Noted  . Aleve [naproxen sodium] Other (See Comments) 04/03/2015   Past Medical History:  Diagnosis Date  . High cholesterol   . Horseshoe kidney   . Hypertension   . Small vessel disease (HCC)   . Stroke [facility](HCC)     Family History: Heart disease and  CKD in father  Social History: Smoking 10 cigarettes per day for 40 years. No drugs, alcohol occasionally   Review of Systems: A complete ROS was negative except as per HPI.  BMP Latest Ref Rng & Units 07/16/2017 07/16/2017 03/05/2016  Glucose 70 - 99 mg/dL - 578(I102(H) 87  BUN 6 - 20 mg/dL - 11 11  Creatinine 6.960.61 - 1.24 mg/dL 2.951.00 2.840.95 1.320.88  BUN/Creat Ratio 9 - 20 - - -  Sodium 135 - 145 mmol/L - 139 137  Potassium 3.5 - 5.1 mmol/L - 4.2 3.9  Chloride 98 - 111 mmol/L - 109 104  CO2 22 - 32 mmol/L - 21(L) 26  Calcium 8.9 - 10.3 mg/dL - 9.2 9.7    Troponin: 0.01--<0.03, <pending  CBC Latest Ref Rng & Units 07/16/2017 07/16/2017 03/05/2016  WBC 4.0 - 10.5 K/uL 9.5 7.9 9.0  Hemoglobin 13.0 - 17.0 g/dL 44.014.9 10.213.9 72.515.3  Hematocrit 39.0 - 52.0 % 46.5 43.8 44.8  Platelets 150 - 400 K/uL 254 225 256   Physical Exam: Blood pressure (!) 151/101, pulse (!) 58, temperature 98.2 F (36.8 C), temperature source Oral, resp. rate 20, height 5\' 11"  (1.803 m), weight 180 lb 1.6 oz (81.7 kg), SpO2 100 %.  Physical Exam  Constitutional:  No distress.  Neck: No JVD present.  Cardiovascular: Normal rate, regular rhythm and normal pulses. Exam reveals no gallop.  No murmur heard. Pulmonary/Chest: Effort normal. No respiratory distress.  Abdominal: Soft. There is no tenderness. There is no guarding.  Musculoskeletal:       Right lower leg: Normal. He exhibits no edema.       Left lower leg: Normal.  Neurological: He is alert.   EKG: personally reviewed my interpretation is T inversion at inferior leads and borderline ST elevation in anterior leads  CXR: personally reviewed my interpretation is  1. Low lung volumes without radiographic evidence of acute cardiopulmonary disease.  Assessment & Plan by Problem:  Active Problems:   Chest pain Atypical chest pain, Unstable angina, in setting of stable angina. He is moderate risk for cardio vascular disease: Heavy Smoker, (borderline) HTN, and HLD on no  medications except for ASA Troponin came back negative x2. ECG showed borderline ST elevation   -ASA -Lipitor 40 mg QD -Ct angio vs  -Follow up with third Troponin -Check Lipid   Dispo: Admit patient to Observation with expected length of stay less than 2 midnights.  Signed: Chevis Pretty, MD 07/16/2017, 9:29 PM  Pager: [phone] @

## 2017-07-16 NOTE — ED Notes (Signed)
Patient transported to X-ray 

## 2017-07-16 NOTE — ED Notes (Signed)
Pt says that he no longer has CP, just pain in his left shoulder

## 2017-07-16 NOTE — ED Notes (Signed)
Pt back from x-ray.

## 2017-07-16 NOTE — ED Triage Notes (Signed)
Pt coming from home via ems; c/o CP since yesterday, worse today, tingling in arms; tingling similar to what he felt when he had stroke 5 years ago; no deficits  324 asa, 1 nitro pta with relief  BP 140/98 HR 80 RR 16 96% RA

## 2017-07-17 ENCOUNTER — Encounter (HOSPITAL_COMMUNITY): Payer: Self-pay | Admitting: Medical

## 2017-07-17 ENCOUNTER — Observation Stay (HOSPITAL_COMMUNITY): Payer: BLUE CROSS/BLUE SHIELD

## 2017-07-17 DIAGNOSIS — Z9119 Patient's noncompliance with other medical treatment and regimen: Secondary | ICD-10-CM

## 2017-07-17 DIAGNOSIS — R0789 Other chest pain: Secondary | ICD-10-CM | POA: Diagnosis not present

## 2017-07-17 DIAGNOSIS — Z79899 Other long term (current) drug therapy: Secondary | ICD-10-CM

## 2017-07-17 DIAGNOSIS — I200 Unstable angina: Secondary | ICD-10-CM | POA: Diagnosis not present

## 2017-07-17 DIAGNOSIS — I25110 Atherosclerotic heart disease of native coronary artery with unstable angina pectoris: Secondary | ICD-10-CM

## 2017-07-17 DIAGNOSIS — M549 Dorsalgia, unspecified: Secondary | ICD-10-CM | POA: Diagnosis not present

## 2017-07-17 DIAGNOSIS — E785 Hyperlipidemia, unspecified: Secondary | ICD-10-CM | POA: Diagnosis not present

## 2017-07-17 DIAGNOSIS — R072 Precordial pain: Secondary | ICD-10-CM | POA: Diagnosis not present

## 2017-07-17 DIAGNOSIS — F17210 Nicotine dependence, cigarettes, uncomplicated: Secondary | ICD-10-CM

## 2017-07-17 DIAGNOSIS — I10 Essential (primary) hypertension: Secondary | ICD-10-CM | POA: Diagnosis not present

## 2017-07-17 DIAGNOSIS — Z886 Allergy status to analgesic agent status: Secondary | ICD-10-CM

## 2017-07-17 DIAGNOSIS — R079 Chest pain, unspecified: Secondary | ICD-10-CM

## 2017-07-17 DIAGNOSIS — Z7982 Long term (current) use of aspirin: Secondary | ICD-10-CM

## 2017-07-17 DIAGNOSIS — Z720 Tobacco use: Secondary | ICD-10-CM

## 2017-07-17 DIAGNOSIS — F17200 Nicotine dependence, unspecified, uncomplicated: Secondary | ICD-10-CM

## 2017-07-17 DIAGNOSIS — Z841 Family history of disorders of kidney and ureter: Secondary | ICD-10-CM

## 2017-07-17 DIAGNOSIS — Z66 Do not resuscitate: Secondary | ICD-10-CM

## 2017-07-17 DIAGNOSIS — E7800 Pure hypercholesterolemia, unspecified: Secondary | ICD-10-CM

## 2017-07-17 DIAGNOSIS — Z9114 Patient's other noncompliance with medication regimen: Secondary | ICD-10-CM

## 2017-07-17 DIAGNOSIS — Q631 Lobulated, fused and horseshoe kidney: Secondary | ICD-10-CM

## 2017-07-17 DIAGNOSIS — Z91199 Patient's noncompliance with other medical treatment and regimen due to unspecified reason: Secondary | ICD-10-CM

## 2017-07-17 DIAGNOSIS — Z8673 Personal history of transient ischemic attack (TIA), and cerebral infarction without residual deficits: Secondary | ICD-10-CM

## 2017-07-17 DIAGNOSIS — Z8249 Family history of ischemic heart disease and other diseases of the circulatory system: Secondary | ICD-10-CM

## 2017-07-17 LAB — BASIC METABOLIC PANEL
ANION GAP: 10 (ref 5–15)
BUN: 12 mg/dL (ref 6–20)
CO2: 25 mmol/L (ref 22–32)
Calcium: 9.4 mg/dL (ref 8.9–10.3)
Chloride: 104 mmol/L (ref 98–111)
Creatinine, Ser: 1.04 mg/dL (ref 0.61–1.24)
GFR calc non Af Amer: >60 mL/min (ref >60)
Glucose, Bld: 88 mg/dL (ref 70–99)
Potassium: 3.9 mmol/L (ref 3.5–5.1)
Sodium: 139 mmol/L (ref 135–145)

## 2017-07-17 LAB — LIPID PANEL
Cholesterol: 268 mg/dL — ABNORMAL HIGH (ref 0–200)
HDL: 31 mg/dL — AB (ref >40)
LDL CALC: 182 mg/dL — AB (ref 0–99)
TRIGLYCERIDES: 275 mg/dL — AB (ref <150)
Total CHOL/HDL Ratio: 8.6 RATIO
VLDL: 55 mg/dL — AB (ref 0–40)

## 2017-07-17 LAB — HIV ANTIBODY (ROUTINE TESTING W REFLEX): HIV Screen 4th Generation wRfx: NONREACTIVE

## 2017-07-17 LAB — GLUCOSE, CAPILLARY: Glucose-Capillary: 78 mg/dL (ref 70–99)

## 2017-07-17 MED ORDER — NITROGLYCERIN 0.4 MG SL SUBL
SUBLINGUAL_TABLET | SUBLINGUAL | Status: AC
  Filled 2017-07-17: qty 2

## 2017-07-17 MED ORDER — IOPAMIDOL (ISOVUE-370) INJECTION 76%
INTRAVENOUS | Status: AC
  Filled 2017-07-17: qty 100

## 2017-07-17 MED ORDER — NITROGLYCERIN 0.4 MG SL SUBL
0.8000 mg | SUBLINGUAL_TABLET | Freq: Once | SUBLINGUAL | Status: AC
  Administered 2017-07-17: 0.8 mg via SUBLINGUAL

## 2017-07-17 MED ORDER — METOPROLOL TARTRATE 50 MG PO TABS
50.0000 mg | ORAL_TABLET | Freq: Once | ORAL | Status: AC
  Administered 2017-07-17: 50 mg via ORAL
  Filled 2017-07-17: qty 1

## 2017-07-17 MED ORDER — IOPAMIDOL (ISOVUE-370) INJECTION 76%
100.0000 mL | Freq: Once | INTRAVENOUS | Status: AC | PRN
  Administered 2017-07-17: 100 mL via INTRAVENOUS

## 2017-07-17 NOTE — Discharge Summary (Signed)
Name: Jesse Sanders MRN: [id number] DOB: July 03, 1965 52 y.o. PCP: Synetta Shadow, MD  Date of Admission: 07/16/2017 10:32 AM Date of Discharge: 07/17/2017 Attending Physician: No att. providers found  Discharge Diagnosis: 1. Atypical chest pain  Discharge Medications: Allergies as of 07/17/2017      Reactions   Aleve [naproxen Sodium] Other (See Comments)   Facial swelling      Medication List    STOP taking these medications   sodium chloride 0.65 % Soln nasal spray Commonly known as:  OCEAN     TAKE these medications   aspirin EC 81 MG tablet Take 1 tablet (81 mg total) by mouth daily.   atorvastatin 40 MG tablet Commonly known as:  LIPITOR Take 1 tablet (40 mg total) by mouth daily.   hydrochlorothiazide 12.5 MG capsule Commonly known as:  MICROZIDE Take 1 capsule (12.5 mg total) by mouth daily.       Disposition and follow-up:   Jesse Sanders was discharged from [facility] in Stable condition.  At the hospital follow up visit please address:  1.  Patient has not been compliant with his medication previously and is a heavy scmoker. Please make sure that he takes his meds regularly and can have sources for his smoke cessation.    2.  Labs / imaging needed at time of follow-up: None  3.  Pending labs/ test needing follow-up: PCP  Follow-up Appointments: Follow-up Information    Synetta Shadow, MD. Schedule an appointment as soon as possible for a visit.   Why:  Please call the clinic to make an appointment to be seen in 1 to 2 weeks. Contact information: [address] [place] [address] [phone]           Hospital Course by problem list: 1. Atypical chest pain:  [name] is a 52 year-old male with history of HTN, HLD, smoking, TIA presented to ED with 1 day Hx of consistent pressure like chest pain. It was with intensity of 10/10, radiated to his left shoulder and arm.  The pain did not change with activity. He  could sleep with no problem at night but in the morning the pain started again and associated with nausea. He then called EMS. His chest pain was improved with 1 sublingual NG in the ambulance, when visited at ED, had no chest pain but mild shoulder pain. In Ph/e he had some tenderness at the chest reproduced similar pain. ECG showed border line st elevation at anterior leads and T inversion at inferior and lateral leads. There was no previous ECG for comparison. Troponin trending x3 came back negative. We treated him with ASA, Statin. Next day after admission, he did not have any more chest pain.  He reports histopry of typical stable angina that he did not pay attention to that. He was not compliant with his antihypertensive med and statin at home before admission and just took Aspirin. Although his chest pain was not typical cardiac, due to his risk factors, we performed CT coronary with contrast. It did not show coronary disease and patient discharged recommended to take prescribed medications and smoke cessation.    Discharge Vitals:   BP 116/77 (BP Location: Right Arm)   Pulse (!) 53   Temp 98.6 F (37 C) (Oral)   Resp 20   Ht 5\' 11"  (1.803 m)   Wt 181 lb 8 oz (82.3 kg)   SpO2 97%   BMI 25.31 kg/m  Pertinent Labs, Studies, and Procedures:  Troponin POC:0.01, <0.03, <0.03 .LDL:182, HDL:31, Trig:275   ECG:1 mm ST elevation in V3 and 0.5 mm ST elevation in V2 and V4, inferolateral T wave inversions in a strain pattern. No prior ECGs available.  CXR: No acute cardiopulmonary abnormality   Discharge Instructions:  Discharge Instructions    Diet - low sodium heart healthy   Complete by:  As directed    Discharge instructions   Complete by:  As directed    It was pleasure taking care of you. Your CT results shows that your heart vessels are fine. You do have risk factors getting heart disease because of your high blood pressure, high cholesterol and smoking. It is very important  to control your risk factor to prevent any future cardiac events. Please take your HCTZ for high blood pressure, Lipitor for cholesterol and aspirin regularly. Please call the clinic tomorrow morning to make an appointment to be seen within 1 to 2 weeks. We really recommend that you stop smoking-if you need help you can ask during your follow-up appointment in the clinic.   Increase activity slowly   Complete by:  As directed       Signed: Chevis PrettyMasoudi, Caitrin Pendergraph, MD 07/17/2017, 7:24 PM   Pager: [phone]

## 2017-07-17 NOTE — Progress Notes (Signed)
   Subjective: Patient was seen today. Mentions that he is doing well and his pain has been resolved completely. No nausea vomiting. I explained his negative Troponin result and our plan for Ct coronary today. We talked about importance of taking his medications, and how untreated high blood pressure, high cholesterol and smoking put him at risk of heart attack. He understands this and agrees to take his medications.   Objective:  Vital signs in last 24 hours: Vitals:   07/16/17 1932 07/17/17 0526 07/17/17 1225 07/17/17 1420  BP: (!) 151/101 (!) 152/97  116/77  Pulse: (!) 58 63 70 (!) 53  Resp:      Temp: 98.2 F (36.8 C) 98 F (36.7 C)  98.6 F (37 C)  TempSrc: Oral Oral  Oral  SpO2: 100% 98%  97%  Weight: 180 lb 1.6 oz (81.7 kg) 181 lb 8 oz (82.3 kg)    Height: 5\' 11"  (1.803 m)      Physical Exam  Constitutional: No distress.  Neck: No JVD present.  Cardiovascular: Normal rate, regular rhythm and normal pulses. Exam reveals no gallop.  No murmur heard. Pulmonary/Chest: Breath sounds normal.  Abdominal: Soft. There is no tenderness.    Assessment/Plan:  Chest pain: Atypical chest pain based on consistent pain lasting for a day. Not changed with activity, reproducible on his chest. ECG yesterday: borderline ST elevation in ant lead and T inversion in inferior and lateral leads. (today ECG has been done but it is not in the Epic yet) Troponin negative x3. No pain any more.  Has HTN, HLD and is a heavy smoker. Current LDL:181, HDL:31. Has been prescribed with ASA, HCTZ and Lipitor before in internal medicine clinic, but is not taking them at home except for ASA. His Hx is positive for stable angina that did not seek any medical attention for that.  Being high risk for cardiovascular disease, we plan for CT coronary today.  -Continue ASA 81mg  QD , Lipitor 40 mg QD, HCTZ12.5 mg QD -CT coronary w contrast   Dispo: Anticipated discharge in approximately 1 day.  Chevis PrettyMasoudi,  Margretta Zamorano, MD 07/17/2017, 2:42 PM Pager: [phone]
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
## 2017-07-17 NOTE — Progress Notes (Signed)
Pt AM EKG was completed, however it did not cross over into Epic. Hard copy placed on pt chart.

## 2017-07-17 NOTE — Progress Notes (Signed)
[facility]. Contacted CT to discuss possible time: 1130-12. Requested order for po metop to be given 1 hour prior to procedure. Will continue to monitor.

## 2017-07-18 ENCOUNTER — Encounter: Payer: Self-pay | Admitting: Internal Medicine

## 2017-07-18 ENCOUNTER — Encounter: Payer: Self-pay | Admitting: *Deleted

## 2017-07-18 ENCOUNTER — Telehealth: Payer: Self-pay | Admitting: *Deleted

## 2017-07-18 MED ORDER — ROSUVASTATIN CALCIUM 20 MG PO TABS: 20.0000 mg | ORAL_TABLET | Freq: Every day | ORAL | 1 refills | Status: DC

## 2017-07-18 NOTE — Telephone Encounter (Signed)
Called pt to inform him of new crestor rx .

## 2017-07-18 NOTE — Telephone Encounter (Signed)
I have dc'd atorvastatin as it is not covered by patients insurance and started rosuvastatin 20 mg

## 2017-07-18 NOTE — Telephone Encounter (Signed)
Call from [facility] - pt's insurance will not cover Atorvastatin, cost to pt is $250.00 #90 tabs. Requesting to be changed to something else. Thanks

## 2017-07-23 ENCOUNTER — Ambulatory Visit (INDEPENDENT_AMBULATORY_CARE_PROVIDER_SITE_OTHER): Payer: BLUE CROSS/BLUE SHIELD | Admitting: Internal Medicine

## 2017-07-23 ENCOUNTER — Other Ambulatory Visit: Payer: Self-pay

## 2017-07-23 VITALS — BP 151/86 | HR 78 | Temp 99.1°F | Ht 71.0 in | Wt 183.4 lb

## 2017-07-23 DIAGNOSIS — R0789 Other chest pain: Secondary | ICD-10-CM | POA: Diagnosis not present

## 2017-07-23 DIAGNOSIS — Z720 Tobacco use: Secondary | ICD-10-CM

## 2017-07-23 DIAGNOSIS — J309 Allergic rhinitis, unspecified: Secondary | ICD-10-CM | POA: Diagnosis not present

## 2017-07-23 DIAGNOSIS — Z79899 Other long term (current) drug therapy: Secondary | ICD-10-CM

## 2017-07-23 DIAGNOSIS — I10 Essential (primary) hypertension: Secondary | ICD-10-CM

## 2017-07-23 DIAGNOSIS — F17210 Nicotine dependence, cigarettes, uncomplicated: Secondary | ICD-10-CM

## 2017-07-23 DIAGNOSIS — R071 Chest pain on breathing: Secondary | ICD-10-CM

## 2017-07-23 MED ORDER — MONTELUKAST SODIUM 10 MG PO TABS: 10.0000 mg | ORAL_TABLET | Freq: Every day | ORAL | 1 refills | Status: DC

## 2017-07-23 MED ORDER — NICOTINE 14 MG/24HR TD PT24: 14.0000 mg | MEDICATED_PATCH | Freq: Every day | TRANSDERMAL | 5 refills | Status: DC

## 2017-07-23 MED ORDER — ALBUTEROL SULFATE HFA 108 (90 BASE) MCG/ACT IN AERS: 1.0000 | INHALATION_SPRAY | Freq: Four times a day (QID) | RESPIRATORY_TRACT | 3 refills | Status: DC | PRN

## 2017-07-23 MED ORDER — FLUTICASONE PROPIONATE 50 MCG/ACT NA SUSP: 1.0000 | Freq: Every day | NASAL | 2 refills | Status: DC

## 2017-07-23 NOTE — Progress Notes (Signed)
CC: hospital follow up to address pleuritic chest pain, allergic rhinitis, HTN, tobacco abuse,   HPI:  Mr.Colonel Ambrosino is a 52 y.o. male with PMH below.  He is here for hospital follow up to address pleuritic chest pain, allergic rhinitis, HTN, tobacco abuse.     Please see A&P for status of the patient's chronic medical conditions  Past Medical History:  Diagnosis Date  . High cholesterol   . Horseshoe kidney   . Hypertension   . Myocardial infarction [facility](HCC) 2014   "they thought it was from taking Chantix" (07/16/2017)  . Sinus headache    "all year round" (07/16/2017)  . Small vessel disease (HCC)   . Stroke [facility](HCC) 2013   denies residual on 07/16/2017   Review of Systems:   Review of Systems  Constitutional: Negative for chills and fever.  HENT: Positive for congestion. Negative for nosebleeds and sore throat.   Eyes: Negative for blurred vision and double vision.  Respiratory: Negative for cough and hemoptysis.   Cardiovascular: Negative for palpitations and orthopnea.  Gastrointestinal: Negative for nausea and vomiting.  Genitourinary: Negative for dysuria and urgency.  Musculoskeletal: Negative for falls and neck pain.  Skin: Negative for rash.  Neurological: Negative for dizziness and tremors.  Endo/Heme/Allergies: Positive for environmental allergies.  Psychiatric/Behavioral: Negative for depression, substance abuse and suicidal ideas.    Physical Exam:  Vitals:   07/23/17 1431  BP: (!) 151/86  Pulse: 78  Temp: 99.1 F (37.3 C)  TempSrc: Oral  SpO2: 97%  Weight: 183 lb 6.4 oz (83.2 kg)  Height: 5\' 11"  (1.803 m)   Physical Exam  Eyes: Right eye exhibits no discharge. Left eye exhibits no discharge. No scleral icterus.  Cardiovascular: Normal rate, regular rhythm, normal heart sounds and intact distal pulses. Exam reveals no gallop and no friction rub.  No murmur heard. Pulmonary/Chest: Effort normal and breath sounds normal. No respiratory distress. He has  no wheezes. He has no rales.  Abdominal: Soft. Bowel sounds are normal. He exhibits no distension and no mass. There is no tenderness. There is no guarding.  Neurological: He is alert.  Skin: He is not diaphoretic.    Social History   Socioeconomic History  . Marital status: Divorced    Spouse name: Not on file  . Number of children: Not on file  . Years of education: Not on file  . Highest education level: Not on file  Occupational History  . 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 Financial resource strain: Not on file  . Food insecurity:    Worry: Not on file    Inability: Not on file  . Transportation needs:    Medical: Not on file    Non-medical: Not on file  Tobacco Use  . Smoking status: Current Every Day Smoker    Packs/day: 1.00    Years: 39.00    Pack years: 39.00    Types: Cigarettes  . Smokeless tobacco: Never Used  . Tobacco comment: 1/2PPD; started at age 52  Substance and Sexual Activity  . Alcohol use: Not Currently    Comment: 07/16/2017 "couple drinks/month"  . Drug use: Never  . Sexual activity: Not Currently    Partners: Female  Lifestyle  . Physical activity:    Days per week: Not on file    Minutes per session: Not on file  . Stress: Not on file  Relationships  . Social connections:    Talks on phone: Not on file  Gets together: Not on file    Attends religious service: Not on file    Active member of club or organization: Not on file    Attends meetings of clubs or organizations: Not on file    Relationship status: Not on file  . Intimate partner violence:    Fear of current or ex partner: Not on file    Emotionally abused: Not on file    Physically abused: Not on file    Forced sexual activity: Not on file  Other Topics Concern  . Not on file  Social History Narrative   Lived in [place] 1999-2008   Smoking 1ppd now he started smoking when he was 12 smoking 1ppd at that time.  Was smoking 2ppd since 2012 tried chantix  failed, gum made him smoke more.  Quit drinking for 3 years   Family History  Problem Relation Age of Onset  . Kidney failure Father        Due to "blood poisoning"  . Heart attack Father        Onset in his 49s with subsequent CABG  . Stroke Father   . Breast cancer Mother   . Heart disease Mother   . Lung cancer Maternal Uncle   . Asthma Sister     Assessment & Plan:   See Encounters Tab for problem based charting.  Patient discussed with Dr. Criselda Peaches

## 2017-07-23 NOTE — Assessment & Plan Note (Signed)
Pt had extensive cardiac workup while inpatient which was negative including negative coronary CT angio.  In talking with the patient his symptoms are only pleuritic and non exertional they are not associated with shortness of breath.  They are worsened with hot humid air outside and he has very little if any pleuritic chest pain in an air conditioned room.  His sister has asthma, he has an extensive smoking history.  I expect he may be suffering from reactive airway disease.    -trial of albuterol rescue inhaler QID PRN -PFTs for further evaluation

## 2017-07-23 NOTE — Assessment & Plan Note (Signed)
BP Readings from Last 3 Encounters:  07/23/17 (!) 151/86  07/17/17 116/77  10/10/16 (!) 147/81   Pt states he just started taking his hctz about 2 days ago.  He did take his dose this morning.  I expect the patient will need a second agent and expressed this to him but for now he would like to stick with single agent hctz 12.5mg  daily.  -will check bp in about one month on hctz 12.5mg , will likely need second agent

## 2017-07-23 NOTE — Assessment & Plan Note (Signed)
Pt has tried quitting in the past quit successfully for a few months in 2012.  He has an extensive smoking history.  He started Smoking 1ppd  when he was 12.  Was smoking 2ppd since 2012 but just before admission for chest pain in early July 2019 back down to 1ppd. tried chantix but failed, gum made him smoke more. He has not tried the patches which appear to be covered by his insurance.    -nicotene patches 14g daily -will consider buproprion if patches not enough to suppress cravings, pt instructed to call in if not working.  Emphasized importance of quitting smoking.

## 2017-07-23 NOTE — Assessment & Plan Note (Signed)
Pt states that he has continued problems with nasal congestion, worsened during warm weather, post nasal drips.  He says that he has tried flonase and zyrtec in the past without good result.  I expect he may have been using the flonase incorrectly which was confirmed after we went over how to use with an infographic.  -prescribed flonase gave instructions with visual aid on how to use, also prescribed singulair 10mg  daily

## 2017-07-23 NOTE — Patient Instructions (Signed)
Mr. Marylyn IshiharaHerbin, you are doing well.  We have ordered pulmonary function tests to better evaluate your lungs which may help us determine the cause of your pleuritic chest pain.  In the mean time try the inhaler I gave you to help with symptoms.  The most important thing to do right now is to get you to quit smoking.  Please let us know if the patches don't workout for you and we can try a medicine to help called buproprion.  We will need to check your blood pressure and cholesterol at your follow up visit as well please return in one month.

## 2017-07-25 NOTE — Progress Notes (Signed)
Internal Medicine Clinic Attending  Case discussed with Dr. Winfrey  at the time of the visit.  We reviewed the resident's history and exam and pertinent patient test results.  I agree with the assessment, diagnosis, and plan of care documented in the resident's note.  

## 2017-07-30 ENCOUNTER — Encounter: Payer: Self-pay | Admitting: *Deleted

## 2017-08-02 ENCOUNTER — Encounter (HOSPITAL_COMMUNITY): Payer: Self-pay | Admitting: Emergency Medicine

## 2017-08-02 ENCOUNTER — Inpatient Hospital Stay (HOSPITAL_COMMUNITY): Admission: RE | Admit: 2017-08-02 | Payer: BLUE CROSS/BLUE SHIELD | Source: Ambulatory Visit

## 2017-08-02 ENCOUNTER — Emergency Department (HOSPITAL_COMMUNITY)
Admission: EM | Admit: 2017-08-02 | Discharge: 2017-08-02 | Disposition: A | Discharge status: Home / Self Care | Payer: BLUE CROSS/BLUE SHIELD | Attending: Emergency Medicine | Admitting: Emergency Medicine

## 2017-08-02 ENCOUNTER — Other Ambulatory Visit: Payer: Self-pay

## 2017-08-02 DIAGNOSIS — Z8673 Personal history of transient ischemic attack (TIA), and cerebral infarction without residual deficits: Secondary | ICD-10-CM | POA: Insufficient documentation

## 2017-08-02 DIAGNOSIS — I10 Essential (primary) hypertension: Secondary | ICD-10-CM | POA: Insufficient documentation

## 2017-08-02 DIAGNOSIS — F17210 Nicotine dependence, cigarettes, uncomplicated: Secondary | ICD-10-CM | POA: Diagnosis not present

## 2017-08-02 DIAGNOSIS — Z79899 Other long term (current) drug therapy: Secondary | ICD-10-CM | POA: Insufficient documentation

## 2017-08-02 DIAGNOSIS — R040 Epistaxis: Secondary | ICD-10-CM

## 2017-08-02 DIAGNOSIS — I252 Old myocardial infarction: Secondary | ICD-10-CM | POA: Diagnosis not present

## 2017-08-02 DIAGNOSIS — Z7982 Long term (current) use of aspirin: Secondary | ICD-10-CM | POA: Diagnosis not present

## 2017-08-02 NOTE — Discharge Instructions (Addendum)
Please follow-up with your primary care provider regarding your visit today.  Your blood pressure was elevated today, please be sure to speak with your primary care provider regarding this as well. Please return to the emergency department for any new or worsening symptoms.  Contact a health care provider if: You have a fever. You get nosebleeds often or more often than usual. You bruise very easily. You have a nosebleed from having something stuck in your nose. You have bleeding in your mouth. You vomit or cough up brown material. You have a nosebleed after you start a new medicine. Get help right away if: You have a nosebleed after a fall or a head injury. Your nosebleed does not go away after 20 minutes. You feel dizzy or weak. You have unusual bleeding from other parts of your body. You have unusual bruising on other parts of your body. You become sweaty. You vomit blood.

## 2017-08-02 NOTE — ED Triage Notes (Signed)
Patient complains of epistaxis from 1000 to 1100 today. Takes on 81mg  aspirin daily, no anticoagulation. Patient alert, oriented, and ambulating independently with steady gait.
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
## 2017-08-02 NOTE — ED Notes (Signed)
Declined W/C at D/C and was escorted to lobby by RN. 

## 2017-08-09 ENCOUNTER — Encounter (HOSPITAL_COMMUNITY): Payer: BLUE CROSS/BLUE SHIELD

## 2017-08-14 ENCOUNTER — Other Ambulatory Visit: Payer: Self-pay | Admitting: Internal Medicine

## 2017-08-14 DIAGNOSIS — J309 Allergic rhinitis, unspecified: Secondary | ICD-10-CM

## 2017-08-20 ENCOUNTER — Encounter (HOSPITAL_COMMUNITY): Payer: BLUE CROSS/BLUE SHIELD

## 2017-10-15 ENCOUNTER — Other Ambulatory Visit: Payer: Self-pay | Admitting: *Deleted

## 2017-10-15 DIAGNOSIS — I10 Essential (primary) hypertension: Secondary | ICD-10-CM

## 2017-10-15 MED ORDER — HYDROCHLOROTHIAZIDE 12.5 MG PO CAPS: 12.5000 mg | ORAL_CAPSULE | Freq: Every day | ORAL | 3 refills | Status: DC

## 2017-10-15 NOTE — Telephone Encounter (Signed)
I will refill his prescription. He needs a follow-up appointment within the next month to have his blood pressure checked and establish care with me. Thank you!

## 2017-10-15 NOTE — Telephone Encounter (Signed)
Appt scheduled 11/6@ 1515 PM with Dr Criss Alvine.

## 2017-11-13 ENCOUNTER — Ambulatory Visit (INDEPENDENT_AMBULATORY_CARE_PROVIDER_SITE_OTHER): Payer: BLUE CROSS/BLUE SHIELD | Admitting: Internal Medicine

## 2017-11-13 ENCOUNTER — Other Ambulatory Visit: Payer: Self-pay

## 2017-11-13 ENCOUNTER — Encounter: Payer: Self-pay | Admitting: Internal Medicine

## 2017-11-13 VITALS — BP 139/83 | HR 64 | Temp 97.9°F | Ht 71.0 in | Wt 183.5 lb

## 2017-11-13 DIAGNOSIS — E785 Hyperlipidemia, unspecified: Secondary | ICD-10-CM | POA: Diagnosis not present

## 2017-11-13 DIAGNOSIS — R0789 Other chest pain: Secondary | ICD-10-CM | POA: Diagnosis not present

## 2017-11-13 DIAGNOSIS — I252 Old myocardial infarction: Secondary | ICD-10-CM

## 2017-11-13 DIAGNOSIS — Z8673 Personal history of transient ischemic attack (TIA), and cerebral infarction without residual deficits: Secondary | ICD-10-CM

## 2017-11-13 DIAGNOSIS — Z720 Tobacco use: Secondary | ICD-10-CM

## 2017-11-13 DIAGNOSIS — R071 Chest pain on breathing: Secondary | ICD-10-CM

## 2017-11-13 DIAGNOSIS — J309 Allergic rhinitis, unspecified: Secondary | ICD-10-CM | POA: Diagnosis not present

## 2017-11-13 DIAGNOSIS — I10 Essential (primary) hypertension: Secondary | ICD-10-CM | POA: Diagnosis not present

## 2017-11-13 DIAGNOSIS — Z7982 Long term (current) use of aspirin: Secondary | ICD-10-CM

## 2017-11-13 DIAGNOSIS — Z79899 Other long term (current) drug therapy: Secondary | ICD-10-CM

## 2017-11-13 DIAGNOSIS — F17201 Nicotine dependence, unspecified, in remission: Secondary | ICD-10-CM

## 2017-11-13 MED ORDER — HYDROCHLOROTHIAZIDE 25 MG PO TABS: 25.0000 mg | ORAL_TABLET | Freq: Every day | ORAL | 2 refills | Status: DC

## 2017-11-13 NOTE — Progress Notes (Signed)
CC: follow-up of his chronic medical conditions  HPI:  [name] is a 52 y.o. male with hypertension, allergic rhinitis, tobacco abuse, history of MI and CVA who presents for follow-up of his chronic medical conditions.  Hypertension: He is currently on hydrochlorothiazide 12.5 mg.  He reports good compliance.  His blood pressure today is 139/83.  His blood pressure has been intermittently elevated with systolics between 140s to 150s and diastolic between 08M to 100s over the last several months.  Allergic rhinitis: Asked to do a trial of Flonase nasal spray.  He states that he attempted to use this medication for several days but developed nose bleeds. He continues to have nasal congestion, sinus pressure, and intermittent difficulty smelling. He has tried many different OTC decongestants including Advil Sinus and pseudoephedrine. He states that these medications will help for several days and then he will again develop congestion.  His symptoms occur throughout the year but is worse when the seasons change.  He denies fevers and SOB.  Chest Pain: He has had an extensive cardiac workup during previous admission which was unremarkable.  At last clinic visit in July 2019, reactive airway disease was suspected.  He was asked to do a trial of albuterol rescue inhaler 4 times daily as needed.  PFTs were ordered but patient has not yet had these done.  He continues to report pleuritic chest pain which occurs with exercise and exerting himself. He also endorses wheezing during these episodes. He uses albuterol as needed which helps alleviate the chest pain and wheezing.   History of TIA: Currently on aspirin 81 mg daily and endorses good compliance.  Tobacco abuse: At the last clinic visit he was given a prescription for nicotine patches.  He reports quitting 10/24/17.  Hyperlipidemia: He is on rosuvastatin 20 mg daily with good compliance.    Past Medical History:  Diagnosis Date  .  High cholesterol   . Horseshoe kidney   . Hypertension   . Myocardial infarction [facility]) 2014   "they thought it was from taking Chantix" (07/16/2017)  . Sinus headache    "all year round" (07/16/2017)  . Small vessel disease (HCC)   . Stroke [facility]) 2013   denies residual on 07/16/2017   Review of Systems:  Review of Systems  Constitutional: Negative for chills, fever and weight loss.  HENT: Positive for congestion, nosebleeds and sinus pain. Negative for ear discharge, ear pain, hearing loss and sore throat.   Eyes: Negative for blurred vision, double vision, photophobia, pain and discharge.  Respiratory: Positive for wheezing. Negative for cough, hemoptysis, sputum production and shortness of breath.   Cardiovascular: Negative for chest pain, palpitations and leg swelling.  Gastrointestinal: Negative for abdominal pain, diarrhea, nausea and vomiting.  Genitourinary: Negative for dysuria, hematuria and urgency.  Skin: Negative for itching and rash.  Neurological: Negative for dizziness, speech change, weakness and headaches.    Physical Exam:  Vitals:   11/13/17 1523  BP: 139/83  Pulse: 64  Temp: 97.9 F (36.6 C)  TempSrc: Oral  SpO2: 100%  Weight: 183 lb 8 oz (83.2 kg)  Height: 5\' 11"  (1.803 m)   Physical Exam  Constitutional: He is oriented to person, place, and time. He appears well-developed and well-nourished.  HENT:  Head: Normocephalic and atraumatic.  Mild tenderness to palpation of maxillary sinuses bilaterally.  Eyes: Pupils are equal, round, and reactive to light. Conjunctivae and EOM are normal.  Cardiovascular: Normal rate, regular rhythm and normal heart sounds.  Pulmonary/Chest: Effort normal and breath sounds normal. No respiratory distress. He has no wheezes. He exhibits no tenderness.  Abdominal: Soft. Bowel sounds are normal. He exhibits no distension. There is no tenderness.  Musculoskeletal: Normal range of motion. He exhibits no edema or tenderness.    Neurological: He is alert and oriented to person, place, and time.  Skin: Skin is warm and dry.  Psychiatric: He has a normal mood and affect. His behavior is normal.    Assessment & Plan:   See Encounters Tab for problem based charting.  Patient seen with Dr. Rogelia Boga

## 2017-11-18 ENCOUNTER — Encounter: Payer: Self-pay | Admitting: Internal Medicine

## 2017-11-18 NOTE — Assessment & Plan Note (Signed)
Assessment: [name] continues to have virginal pleuritic chest pain which is associated with wheezing and improved with albuterol use.  I do believe that his symptoms are likely secondary to reactive airway disease.  PFTs were ordered at his last clinic visit several months ago but he did not have these done.  I encouraged him to have these done as soon as possible.  He expressed understanding and agreement.  Plan: 1.  Continue albuterol 4 times daily as needed 2.  PFTs have been ordered

## 2017-11-18 NOTE — Assessment & Plan Note (Signed)
Assessment: Currently on aspirin 81 mg daily with good compliance.  Plan: 1.  Continue aspirin 81 mg daily

## 2017-11-18 NOTE — Assessment & Plan Note (Signed)
Assessment: Currently on rosuvastatin 20 mg daily with good compliance.  Plan: 1.  Continue rosuvastatin 20 mg daily.

## 2017-11-18 NOTE — Assessment & Plan Note (Signed)
Assessment: Jesse Sanders blood pressure is not at goal on hydrochlorothiazide 12.5 mg daily.  We will increase his dosage to 25 mg daily and have him follow-up in 1 month for blood pressure recheck and BMP.  Plan: 1.  Increase dosage of hydrochlorothiazide to 25 mg daily 2.  Recommend BP recheck and BMP in 1 month

## 2017-11-18 NOTE — Assessment & Plan Note (Signed)
Assessment: Jesse Sanders has symptoms consistent with chronic rhinosinusitis (nasal mucopurulent drainage, nasal obstruction, facial pain, loss of sense of smell).  He has been on montelukast for several months without alleviation of his symptoms.  He has also failed standard therapy with inhaled glucocorticoids.  I will order a CT maxillofacial without contrast to evaluate for mucosal thickening and structural abnormalities such as nasal polyposis. I will also send a referral for allergy and immunology to further evaluate his chronic rhinosinusitis.  Plan: 1.  Ordered CT maxillofacial 2.  Sent referral to allergy and immunology 3.  Discontinue montelukast given that it is not alleviating his symptoms.

## 2017-11-18 NOTE — Assessment & Plan Note (Signed)
Assessment: He reports complete tobacco cessation 1 month ago.  Plan: 1.  Encouraged continued cessation of tobacco products.

## 2017-11-20 NOTE — Progress Notes (Signed)
Internal Medicine Clinic Attending  I saw and evaluated the patient.  I personally confirmed the key portions of the history and exam documented by Dr. Prince and I reviewed pertinent patient test results.  The assessment, diagnosis, and plan were formulated together and I agree with the documentation in the resident's note.  

## 2017-11-26 ENCOUNTER — Emergency Department (HOSPITAL_BASED_OUTPATIENT_CLINIC_OR_DEPARTMENT_OTHER)
Admission: EM | Admit: 2017-11-26 | Discharge: 2017-11-26 | Disposition: A | Discharge status: Home / Self Care | Payer: BLUE CROSS/BLUE SHIELD | Attending: Emergency Medicine | Admitting: Emergency Medicine

## 2017-11-26 ENCOUNTER — Encounter: Payer: Self-pay | Admitting: Internal Medicine

## 2017-11-26 ENCOUNTER — Ambulatory Visit: Payer: BLUE CROSS/BLUE SHIELD

## 2017-11-26 ENCOUNTER — Other Ambulatory Visit: Payer: Self-pay

## 2017-11-26 ENCOUNTER — Encounter (HOSPITAL_BASED_OUTPATIENT_CLINIC_OR_DEPARTMENT_OTHER): Payer: Self-pay | Admitting: *Deleted

## 2017-11-26 DIAGNOSIS — Z79899 Other long term (current) drug therapy: Secondary | ICD-10-CM | POA: Insufficient documentation

## 2017-11-26 DIAGNOSIS — Z7982 Long term (current) use of aspirin: Secondary | ICD-10-CM | POA: Diagnosis not present

## 2017-11-26 DIAGNOSIS — R51 Headache: Secondary | ICD-10-CM | POA: Diagnosis present

## 2017-11-26 DIAGNOSIS — J329 Chronic sinusitis, unspecified: Secondary | ICD-10-CM | POA: Diagnosis not present

## 2017-11-26 DIAGNOSIS — F17210 Nicotine dependence, cigarettes, uncomplicated: Secondary | ICD-10-CM | POA: Insufficient documentation

## 2017-11-26 DIAGNOSIS — I10 Essential (primary) hypertension: Secondary | ICD-10-CM | POA: Diagnosis not present

## 2017-11-26 MED ORDER — CETIRIZINE-PSEUDOEPHEDRINE ER 5-120 MG PO TB12: 1.0000 | ORAL_TABLET | Freq: Two times a day (BID) | ORAL | 0 refills | Status: DC

## 2017-11-26 NOTE — ED Triage Notes (Signed)
Facial pain since yesterday. States he is scheduled to see a doctor about frequent sinusitis.

## 2017-11-26 NOTE — Discharge Instructions (Signed)
Take Zyrtec-D twice daily for nasal congestion.  Use Simply Saline as prescribed over-the-counter.  It is important you follow-up for your CT tomorrow and see the specialist.  You can keep calling to see if they have any cancellations.  Please return to emergency department if you develop any new or worsening symptoms.
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
## 2017-11-27 ENCOUNTER — Ambulatory Visit (HOSPITAL_COMMUNITY): Payer: BLUE CROSS/BLUE SHIELD

## 2017-12-23 ENCOUNTER — Ambulatory Visit: Payer: Self-pay | Admitting: Allergy and Immunology

## 2017-12-25 ENCOUNTER — Other Ambulatory Visit: Payer: Self-pay

## 2017-12-25 ENCOUNTER — Ambulatory Visit (INDEPENDENT_AMBULATORY_CARE_PROVIDER_SITE_OTHER): Payer: BLUE CROSS/BLUE SHIELD | Admitting: Internal Medicine

## 2017-12-25 ENCOUNTER — Ambulatory Visit (HOSPITAL_COMMUNITY)
Admission: RE | Admit: 2017-12-25 | Discharge: 2017-12-25 | Disposition: A | Discharge status: Home / Self Care | Payer: BLUE CROSS/BLUE SHIELD | Source: Ambulatory Visit | Attending: Oncology | Admitting: Oncology

## 2017-12-25 ENCOUNTER — Encounter: Payer: Self-pay | Admitting: Internal Medicine

## 2017-12-25 VITALS — BP 130/85 | HR 68 | Temp 98.1°F | Ht 71.0 in | Wt 182.5 lb

## 2017-12-25 DIAGNOSIS — E785 Hyperlipidemia, unspecified: Secondary | ICD-10-CM | POA: Diagnosis not present

## 2017-12-25 DIAGNOSIS — M5412 Radiculopathy, cervical region: Secondary | ICD-10-CM

## 2017-12-25 DIAGNOSIS — J309 Allergic rhinitis, unspecified: Secondary | ICD-10-CM | POA: Diagnosis not present

## 2017-12-25 DIAGNOSIS — R3589 Other polyuria: Secondary | ICD-10-CM

## 2017-12-25 DIAGNOSIS — M79602 Pain in left arm: Secondary | ICD-10-CM

## 2017-12-25 DIAGNOSIS — R350 Frequency of micturition: Secondary | ICD-10-CM

## 2017-12-25 DIAGNOSIS — R358 Other polyuria: Secondary | ICD-10-CM

## 2017-12-25 DIAGNOSIS — I252 Old myocardial infarction: Secondary | ICD-10-CM

## 2017-12-25 DIAGNOSIS — I10 Essential (primary) hypertension: Secondary | ICD-10-CM | POA: Diagnosis not present

## 2017-12-25 DIAGNOSIS — Z8673 Personal history of transient ischemic attack (TIA), and cerebral infarction without residual deficits: Secondary | ICD-10-CM

## 2017-12-25 DIAGNOSIS — Z87448 Personal history of other diseases of urinary system: Secondary | ICD-10-CM

## 2017-12-25 DIAGNOSIS — R0602 Shortness of breath: Secondary | ICD-10-CM

## 2017-12-25 DIAGNOSIS — Z79899 Other long term (current) drug therapy: Secondary | ICD-10-CM

## 2017-12-25 DIAGNOSIS — Z720 Tobacco use: Secondary | ICD-10-CM

## 2017-12-25 MED ORDER — HYDROCHLOROTHIAZIDE 25 MG PO TABS: 25.0000 mg | ORAL_TABLET | Freq: Every day | ORAL | 2 refills | Status: DC

## 2017-12-25 MED ORDER — ALBUTEROL SULFATE HFA 108 (90 BASE) MCG/ACT IN AERS: 1.0000 | INHALATION_SPRAY | Freq: Four times a day (QID) | RESPIRATORY_TRACT | 3 refills | Status: DC | PRN

## 2017-12-25 NOTE — Progress Notes (Signed)
CC: Blood pressure check  HPI:  [name] is a 52 y.o. male with hyperlipidemia, hypertension, allergic rhinitis, tobacco abuse, history of MI and CVA who presents for blood pressure check.  Hypertension: [name] blood pressure was elevated on last clinic visit on 11-18-2017.  His hydrochlorothiazide dose was increased to 25 mg daily.  Reports good compliance with this medication.  His blood pressure today is 130/85.  Chronic allergic rhinitis: [name] has a long history of chronic rhinosinusitis with nasal mucopurulent drainage, nasal obstruction, facial pain, and loss of sense of smell.  CT maxillofacial was ordered to evaluate for mucosal thickening and structural abnormalities.  He has an appointment with allergy and immunology on 01/06/2018. He also states for the last month he has had intermittent shortness of breath with heavy lifting. He states that he has had to use his albuterol inhaler multiple times without alleviation of symptoms. He attributes his increased SOB to quitting smoking.  Increased Urinary Frequency: [name] reports a 2 day history of increased urinary frequency stating that he has ben urinating up to 10 times per day. He denies dysuria and hematuria. He does state that he has not had this issue today stating that in the last 8 hours he has only been 2 times.  Left shoulder pain: [name] reports a 1 month history of left shoulder pain which is worse with movement. He also has left shooting "tingling feeling" that starts in his neck and radiates down his left arm. He denies arm weakness or changes in sensation.   Past Medical History:  Diagnosis Date  . High cholesterol   . Horseshoe kidney   . Hypertension   . Myocardial infarction [facility]) 2014   "they thought it was from taking Chantix" (07/16/2017)  . Sinus headache    "all year round" (07/16/2017)  . Small vessel disease (HCC)   . Stroke [facility]) 2013   denies residual on 07/16/2017   Review  of Systems:   Review of Systems  Constitutional: Negative for chills, fever and weight loss.  HENT: Positive for congestion. Negative for sinus pain and sore throat.   Respiratory: Positive for shortness of breath. Negative for cough, sputum production and wheezing.   Cardiovascular: Negative for chest pain and palpitations.  Gastrointestinal: Negative for abdominal pain, diarrhea and vomiting.  Genitourinary: Positive for frequency. Negative for dysuria, hematuria and urgency.  Musculoskeletal: Negative for myalgias.  Neurological: Negative for dizziness and headaches.    Physical Exam:  Vitals:   12/25/17 1502  BP: 130/85  Pulse: 68  Temp: 98.1 F (36.7 C)  TempSrc: Oral  SpO2: 100%  Weight: 182 lb 8 oz (82.8 kg)  Height: 5\' 11"  (1.803 m)   Physical Exam Vitals signs and nursing note reviewed.  Constitutional:      Appearance: Normal appearance.  HENT:     Head: Normocephalic and atraumatic.     Nose: Nose normal. No congestion or rhinorrhea.     Mouth/Throat:     Mouth: Mucous membranes are dry.     Pharynx: No oropharyngeal exudate or posterior oropharyngeal erythema.  Eyes:     Extraocular Movements: Extraocular movements intact.  Cardiovascular:     Rate and Rhythm: Normal rate and regular rhythm.  Pulmonary:     Effort: Pulmonary effort is normal. No respiratory distress.     Breath sounds: Normal breath sounds. No wheezing.  Musculoskeletal:        General: No tenderness.     Right lower leg:  No edema.     Left lower leg: No edema.     Comments: Tenderness to palpation of the most most superior aspect of the scapula.   Skin:    General: Skin is warm and dry.  Neurological:     Mental Status: He is alert.     Comments: Normal strength and sensation to the LUE.  Psychiatric:        Mood and Affect: Mood normal.        Behavior: Behavior normal.     Assessment & Plan:   See Encounters Tab for problem based charting.  Patient seen with Dr.  Cyndie ChimeGranfortuna

## 2017-12-28 DIAGNOSIS — R3589 Other polyuria: Secondary | ICD-10-CM | POA: Insufficient documentation

## 2017-12-28 DIAGNOSIS — R350 Frequency of micturition: Secondary | ICD-10-CM | POA: Insufficient documentation

## 2017-12-28 DIAGNOSIS — R358 Other polyuria: Secondary | ICD-10-CM | POA: Insufficient documentation

## 2017-12-28 DIAGNOSIS — R0602 Shortness of breath: Secondary | ICD-10-CM | POA: Insufficient documentation

## 2017-12-28 NOTE — Assessment & Plan Note (Signed)
Assessment: [name] has a 2 day history of increased urinary frequency which has now resolved. He has no signs of infections including hematuria and dysuria. Will continue to monitor for now.  Plan: 1. Continue to monitor

## 2017-12-28 NOTE — Assessment & Plan Note (Signed)
Assessment: He continues to have congestion and sinus pressure despite use of Ceterizine D (prescribed at recent ED visit). He has a CT maxillofacial and an appt with allergy/immunology this soon for further evaluation of his chronic allergic rhinitis.  Plan: 1. Encouraged him to get the CT and attend his upcoming allergy/immunology appt.

## 2017-12-28 NOTE — Assessment & Plan Note (Signed)
Assessment: [name] has had increased blood pressure over the last several clinic visits despite use of HCTZ 12.5 mg QD. I increased his dose to 25 mg QD but he did not start taking this. His blood pressure today is WNL while on the lower dose. I will keep his HCTZ at 12.5 mg and repeat BP check at next clinic visit.  Plan: 1. Continue HCTZ 12.5 mg QD

## 2017-12-28 NOTE — Assessment & Plan Note (Signed)
Assessment: Jesse Sanders has had a 1 month history of shooting pain radiating down his left arm consistent with cervical radiculopathy. Cervical spine X-ray performed today showed multilevel cervical disc degeneration without acute osseous abnormality. There is also varying degrees of osseous neural foraminal stenosis are present throughout the cervical spine bilaterally.  Plan: 1. I will call Jesse Sanders to discuss results and consider referral to orthopedic surgery.

## 2017-12-28 NOTE — Assessment & Plan Note (Signed)
Assessment: Jesse Sanders has a several month history of SOB with exertion. These symptoms have worsened over the last month with the need for increased albuterol treatment. He does have a normal physical exam including normal lung sounds which is reassuring. It is unclear of whether he has reactive airway disease and PFT's have been ordered in the past. I will arrange for PFT's to be scheduled to further evaluate for an underlying pulmonary process.  Plan: 1. Schedule PFT's

## 2017-12-30 ENCOUNTER — Ambulatory Visit (HOSPITAL_COMMUNITY)
Admission: RE | Admit: 2017-12-30 | Discharge: 2017-12-30 | Disposition: A | Discharge status: Home / Self Care | Payer: BLUE CROSS/BLUE SHIELD | Source: Ambulatory Visit | Attending: Internal Medicine | Admitting: Internal Medicine

## 2017-12-30 DIAGNOSIS — J309 Allergic rhinitis, unspecified: Secondary | ICD-10-CM | POA: Insufficient documentation

## 2017-12-30 NOTE — Progress Notes (Signed)
Medicine attending: I personally interviewed and briefly examined this patient on the day of the patient visit and reviewed pertinent clinical laboratory data  with resident physician Dr. Jamie Prince and we discussed a management plan. 

## 2018-01-06 ENCOUNTER — Ambulatory Visit: Payer: Self-pay | Admitting: Allergy and Immunology

## 2018-01-13 NOTE — Addendum Note (Signed)
Addended by: Neomia Dear on: 01/13/2018 11:37 AM   Modules accepted: Orders

## 2018-01-15 ENCOUNTER — Telehealth: Payer: Self-pay | Admitting: *Deleted

## 2018-01-15 ENCOUNTER — Other Ambulatory Visit: Payer: Self-pay | Admitting: Internal Medicine

## 2018-01-15 NOTE — Telephone Encounter (Signed)
PATIENT CONTACTED WITH APPOINTMENT TO XRAY FOR MAXIFACIAL  [facility]. 01-21-2018 3:30PM ARRIVE 3:15PM

## 2018-01-21 ENCOUNTER — Ambulatory Visit (HOSPITAL_COMMUNITY): Payer: BLUE CROSS/BLUE SHIELD | Attending: Internal Medicine

## 2018-04-28 ENCOUNTER — Other Ambulatory Visit: Payer: Self-pay | Admitting: Internal Medicine

## 2018-04-28 DIAGNOSIS — J309 Allergic rhinitis, unspecified: Secondary | ICD-10-CM

## 2018-04-28 DIAGNOSIS — R0602 Shortness of breath: Secondary | ICD-10-CM

## 2018-06-29 ENCOUNTER — Encounter: Payer: Self-pay | Admitting: *Deleted

## 2019-04-28 ENCOUNTER — Other Ambulatory Visit: Payer: Self-pay | Admitting: *Deleted

## 2019-04-28 DIAGNOSIS — R0602 Shortness of breath: Secondary | ICD-10-CM

## 2019-04-28 DIAGNOSIS — J309 Allergic rhinitis, unspecified: Secondary | ICD-10-CM

## 2019-04-28 NOTE — Telephone Encounter (Signed)
Needs appt ASAP for refills

## 2019-04-29 NOTE — Telephone Encounter (Signed)
Pt needs appt. Not seen since 2019. Can refill at appointment.

## 2019-05-01 NOTE — Telephone Encounter (Signed)
Spoke with the patient.  APpt has been sch for 05/07/2019 @ 3:45 pm with [facility].

## 2019-05-07 ENCOUNTER — Encounter: Payer: Self-pay | Admitting: Internal Medicine

## 2019-05-07 ENCOUNTER — Other Ambulatory Visit: Payer: Self-pay

## 2019-05-07 ENCOUNTER — Ambulatory Visit: Payer: BLUE CROSS/BLUE SHIELD | Admitting: Internal Medicine

## 2019-05-07 VITALS — BP 151/81 | HR 87 | Temp 98.4°F | Ht 71.0 in | Wt 186.7 lb

## 2019-05-07 DIAGNOSIS — E785 Hyperlipidemia, unspecified: Secondary | ICD-10-CM

## 2019-05-07 DIAGNOSIS — R0602 Shortness of breath: Secondary | ICD-10-CM

## 2019-05-07 DIAGNOSIS — I10 Essential (primary) hypertension: Secondary | ICD-10-CM | POA: Diagnosis not present

## 2019-05-07 DIAGNOSIS — J309 Allergic rhinitis, unspecified: Secondary | ICD-10-CM

## 2019-05-07 MED ORDER — FLUNISOLIDE 25 MCG/ACT (0.025%) NA SOLN: 2.0000 | Freq: Two times a day (BID) | NASAL | 2 refills | Status: DC

## 2019-05-07 MED ORDER — ALBUTEROL SULFATE HFA 108 (90 BASE) MCG/ACT IN AERS: 1.0000 | INHALATION_SPRAY | Freq: Four times a day (QID) | RESPIRATORY_TRACT | 1 refills | Status: DC | PRN

## 2019-05-07 MED ORDER — CETIRIZINE HCL 10 MG PO TABS: 10.0000 mg | ORAL_TABLET | Freq: Every day | ORAL | 2 refills | Status: DC

## 2019-05-07 MED ORDER — ROSUVASTATIN CALCIUM 20 MG PO TABS: 20.0000 mg | ORAL_TABLET | Freq: Every day | ORAL | 1 refills | Status: DC

## 2019-05-07 NOTE — Patient Instructions (Signed)
Mr. Stockley,  It was nice meeting you!   Today we discussed your blood pressure - it is up a little today, but I think this will normalize once you get back on your medication.   Your shortness of breath, I am ordering lung function testing. In the mean time, your albuterol inhaler has been refilled.   Allergies/nasal congestion - you may have better luck with Nasonex, a different spray, as well as taking zyrtec daily.
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
## 2019-05-08 LAB — LIPID PANEL
Chol/HDL Ratio: 6.3 ratio — ABNORMAL HIGH (ref 0.0–5.0)
Cholesterol, Total: 245 mg/dL — ABNORMAL HIGH (ref 100–199)
HDL: 39 mg/dL — ABNORMAL LOW (ref >39)
LDL Chol Calc (NIH): 167 mg/dL — ABNORMAL HIGH (ref 0–99)
Triglycerides: 211 mg/dL — ABNORMAL HIGH (ref 0–149)
VLDL Cholesterol Cal: 39 mg/dL (ref 5–40)

## 2019-05-08 LAB — BMP8+ANION GAP
Anion Gap: 16 mmol/L (ref 10.0–18.0)
BUN/Creatinine Ratio: 10 (ref 9–20)
BUN: 10 mg/dL (ref 6–24)
CO2: 21 mmol/L (ref 20–29)
Calcium: 9.8 mg/dL (ref 8.7–10.2)
Chloride: 104 mmol/L (ref 96–106)
Creatinine, Ser: 1.05 mg/dL (ref 0.76–1.27)
GFR calc Af Amer: 94 mL/min/{1.73_m2} (ref >59)
GFR calc non Af Amer: 81 mL/min/{1.73_m2} (ref >59)
Glucose: 99 mg/dL (ref 65–99)
Potassium: 4.1 mmol/L (ref 3.5–5.2)
Sodium: 141 mmol/L (ref 134–144)

## 2019-05-12 ENCOUNTER — Encounter: Payer: Self-pay | Admitting: Internal Medicine

## 2019-05-12 NOTE — Assessment & Plan Note (Signed)
Patient presents for follow-up after not being in our office since 2019 before the COVID pandemic began. His blood pressure is slightly up today, but he has been out of his medication for quite some time. Will refill this today and have him follow-up for re-check with PCP in 6 weeks. BMP today demonstrates normal electrolytes and renal function.

## 2019-05-12 NOTE — Assessment & Plan Note (Signed)
Patient endorses chronic nasal congestion that worsens during seasonal changes. He had issues with nose bleeds when taking Flonase. Will switch to a different nasal corticosteroid and start daily cetirizine.

## 2019-05-12 NOTE — Assessment & Plan Note (Signed)
Patient endorses longstanding history of dyspnea with exertion, particularly when he lifts heavy objects at work and has to wear a mask. He uses albuterol prn which seems to help. We have attempted to get PFTs on him which have never been done so have placed an updated order for this today.

## 2019-05-15 NOTE — Addendum Note (Signed)
Addended by: Blanch Media A on: 05/15/2019 03:47 PM   Modules accepted: Level of Service

## 2019-05-15 NOTE — Progress Notes (Signed)
Internal Medicine Clinic Attending  Case discussed with Dr. Bloomfield at the time of the visit.  We reviewed the resident's history and exam and pertinent patient test results.  I agree with the assessment, diagnosis, and plan of care documented in the resident's note.  

## 2019-07-28 ENCOUNTER — Other Ambulatory Visit: Payer: Self-pay | Admitting: Internal Medicine

## 2019-07-28 DIAGNOSIS — J309 Allergic rhinitis, unspecified: Secondary | ICD-10-CM

## 2019-07-28 DIAGNOSIS — R0602 Shortness of breath: Secondary | ICD-10-CM

## 2020-05-04 ENCOUNTER — Other Ambulatory Visit: Payer: Self-pay | Admitting: Internal Medicine

## 2020-05-04 DIAGNOSIS — J309 Allergic rhinitis, unspecified: Secondary | ICD-10-CM

## 2020-05-04 DIAGNOSIS — R0602 Shortness of breath: Secondary | ICD-10-CM

## 2020-05-04 NOTE — Telephone Encounter (Signed)
Call to pt, no answer, message left on recorder to make an appt.Kingsley Spittle Cassady4/27/202211:23 AM

## 2020-07-12 ENCOUNTER — Encounter: Payer: Self-pay | Admitting: *Deleted

## 2020-09-15 ENCOUNTER — Other Ambulatory Visit: Payer: Self-pay | Admitting: *Deleted

## 2020-09-15 DIAGNOSIS — J309 Allergic rhinitis, unspecified: Secondary | ICD-10-CM

## 2020-09-15 DIAGNOSIS — R0602 Shortness of breath: Secondary | ICD-10-CM

## 2020-09-15 MED ORDER — ALBUTEROL SULFATE HFA 108 (90 BASE) MCG/ACT IN AERS: 1.0000 | INHALATION_SPRAY | Freq: Four times a day (QID) | RESPIRATORY_TRACT | 1 refills | Status: DC | PRN

## 2020-10-17 ENCOUNTER — Encounter: Payer: Self-pay | Admitting: Internal Medicine

## 2021-01-17 ENCOUNTER — Other Ambulatory Visit: Payer: Self-pay

## 2021-01-17 ENCOUNTER — Encounter: Payer: Self-pay | Admitting: Internal Medicine

## 2021-01-17 ENCOUNTER — Ambulatory Visit (INDEPENDENT_AMBULATORY_CARE_PROVIDER_SITE_OTHER): Payer: 59 | Admitting: Internal Medicine

## 2021-01-17 VITALS — BP 161/111 | HR 75 | Temp 98.4°F | Resp 28 | Ht 71.0 in | Wt 179.1 lb

## 2021-01-17 DIAGNOSIS — J309 Allergic rhinitis, unspecified: Secondary | ICD-10-CM

## 2021-01-17 DIAGNOSIS — Z1211 Encounter for screening for malignant neoplasm of colon: Secondary | ICD-10-CM | POA: Diagnosis not present

## 2021-01-17 DIAGNOSIS — Z0000 Encounter for general adult medical examination without abnormal findings: Secondary | ICD-10-CM

## 2021-01-17 DIAGNOSIS — Z8673 Personal history of transient ischemic attack (TIA), and cerebral infarction without residual deficits: Secondary | ICD-10-CM

## 2021-01-17 DIAGNOSIS — R0602 Shortness of breath: Secondary | ICD-10-CM

## 2021-01-17 DIAGNOSIS — E785 Hyperlipidemia, unspecified: Secondary | ICD-10-CM

## 2021-01-17 DIAGNOSIS — I10 Essential (primary) hypertension: Secondary | ICD-10-CM | POA: Diagnosis not present

## 2021-01-17 DIAGNOSIS — I208 Other forms of angina pectoris: Secondary | ICD-10-CM

## 2021-01-17 DIAGNOSIS — F17200 Nicotine dependence, unspecified, uncomplicated: Secondary | ICD-10-CM

## 2021-01-17 MED ORDER — MONTELUKAST SODIUM 10 MG PO TABS: 10.0000 mg | ORAL_TABLET | Freq: Every day | ORAL | 1 refills | Status: DC

## 2021-01-17 MED ORDER — ROSUVASTATIN CALCIUM 20 MG PO TABS: 20.0000 mg | ORAL_TABLET | Freq: Every day | ORAL | 1 refills | Status: DC

## 2021-01-17 MED ORDER — ALBUTEROL SULFATE HFA 108 (90 BASE) MCG/ACT IN AERS: 1.0000 | INHALATION_SPRAY | Freq: Four times a day (QID) | RESPIRATORY_TRACT | 1 refills | Status: DC | PRN

## 2021-01-17 MED ORDER — HYDROCHLOROTHIAZIDE 25 MG PO TABS: 25.0000 mg | ORAL_TABLET | Freq: Every day | ORAL | 2 refills | Status: DC

## 2021-01-17 MED ORDER — NITROGLYCERIN 0.4 MG SL SUBL: 0.4000 mg | SUBLINGUAL_TABLET | SUBLINGUAL | 0 refills | Status: DC | PRN

## 2021-01-17 NOTE — Patient Instructions (Addendum)
I have placed a referral to cardiology for evaluation of the chest pressure. I have prescribed nitroglycerin tablets which you can try if you develop this chest pain.  I would like to work with you on controlling your blood pressure and cholesterol. I have restarted a couple of medications that you have been on in the past. Please follow up with me in 2-3 weeks so we can recheck your blood pressure and labs.

## 2021-01-18 ENCOUNTER — Encounter: Payer: Self-pay | Admitting: Internal Medicine

## 2021-01-18 LAB — BASIC METABOLIC PANEL
BUN/Creatinine Ratio: 11 (ref 9–20)
BUN: 10 mg/dL (ref 6–24)
CO2: 23 mmol/L (ref 20–29)
Calcium: 10.1 mg/dL (ref 8.7–10.2)
Chloride: 102 mmol/L (ref 96–106)
Creatinine, Ser: 0.94 mg/dL (ref 0.76–1.27)
Glucose: 83 mg/dL (ref 70–99)
Potassium: 4.4 mmol/L (ref 3.5–5.2)
Sodium: 140 mmol/L (ref 134–144)
eGFR: 96 mL/min/{1.73_m2} (ref >59)

## 2021-01-18 LAB — LIPID PANEL
Chol/HDL Ratio: 5.6 ratio — ABNORMAL HIGH (ref 0.0–5.0)
Cholesterol, Total: 254 mg/dL — ABNORMAL HIGH (ref 100–199)
HDL: 45 mg/dL (ref >39)
LDL Chol Calc (NIH): 187 mg/dL — ABNORMAL HIGH (ref 0–99)
Triglycerides: 121 mg/dL (ref 0–149)
VLDL Cholesterol Cal: 22 mg/dL (ref 5–40)

## 2021-01-18 NOTE — Assessment & Plan Note (Signed)
~  80 pack year smoking history. Started smoking at age 56, smoked 2 packs per day for several years and recently cut back to one pack.  Based on history and exam, suspect COPD. PFTs previously ordered in 2019 however does not appear that he followed through with this. Based on his other more pressing issues today, we did not discuss PFTs today.  He did not tolerate chantix due to it making him "jittery". Failed prior attempt with nicotine patches. We discussed consideration of trying wellbutrin in the future. He will need LDCT for lung cancer screening however, due to multiple other items addressed at today's visit, we will revisit this at a future visit.

## 2021-01-18 NOTE — Assessment & Plan Note (Signed)
He reports having been given cologuard in the past but ultimately did not follow through with it. Discussed the importance of colon cancer screening and he has elected to proceed with colonoscopy. GI referral placed. Declines influenza, pneumonia, COVID 19, shingrix vaccinations.

## 2021-01-18 NOTE — Assessment & Plan Note (Signed)
LOV was in 2021, no medication use in the interm.  Blood pressure is above goal in the office today--161/111. He denies having ever been told he has hypertension, having been told that he his blood pressures are "borderline not requiring treatment". Based on review of prior vitals, his problem and medication lists, I would consider this to be accurate. Hctz 25mg  listed on medication list.  I reiterated the importance of blood pressure control.   Resume HCTZ 25mg  daily  Bmp today  Follow up 2w for repeat blood pressure and labs

## 2021-01-18 NOTE — Assessment & Plan Note (Signed)
Previously prescribed crestor 20mg  however has not been taking it for several years.  Resume crestor  Lipid panel today

## 2021-01-18 NOTE — Assessment & Plan Note (Signed)
Takes asprin 81mg  daily for antiplatelet therapy.

## 2021-01-18 NOTE — Progress Notes (Signed)
Office Visit   Patient ID: Jesse Sanders, male    DOB: [birth year], 56 y.o.   MRN: [id number]   PCP: [name], MD   Subjective:  CC: Hyperlipidemia and Hypertension   Jesse Sanders is a 56 y.o. year old male who presents for follow up of the above chronic conditions. Please refer to problem based charting for assessment and plan.   ACTIVE MEDICATIONS   Asprin 81mg   Objective:   BP (!) 161/111 (BP Location: Left Arm, Cuff Size: Normal)    Pulse 75    Temp 98.4 F (36.9 C) (Oral)    Resp (!) 28    Ht 5\' 11"  (1.803 m)    Wt 179 lb 1.6 oz (81.2 kg)    SpO2 100% Comment: room air   BMI 24.98 kg/m   BP Readings from Last 3 Encounters:  01/17/21 (!) 161/111  05/07/19 (!) 151/81  12/25/17 130/85   General: well appearing male, no distress Cardiac: RRR, no LE edema Pulm: lung sounds diminished throughout  Health Maintenance:   Health Maintenance  Topic Date Due   COVID-19 Vaccine (1) Never done   Hepatitis C Screening  Never done   COLONOSCOPY (Pts 45-42yrs Insurance coverage will need to be confirmed)  Never done   Zoster Vaccines- Shingrix (1 of 2) Never done   INFLUENZA VACCINE  Never done   TETANUS/TDAP  01/31/2023   HIV Screening  Completed   Pneumococcal Vaccine 55-41 Years old  Aged Out   HPV VACCINES  Aged Out    Assessment & Plan:   Problem List Items Addressed This Visit       Cardiovascular and Mediastinum   HTN (hypertension) (Chronic)    LOV was in 2021, no medication use in the interm.  Blood pressure is above goal in the office today--161/111. He denies having ever been told he has hypertension, having been told that he his blood pressures are "borderline not requiring treatment". Based on review of prior vitals, his problem and medication lists, I would consider this to be accurate. Hctz 25mg  listed on medication list.  I reiterated the importance of blood pressure control.  Resume HCTZ 25mg  daily Bmp today Follow up 2w for repeat blood  pressure and labs      Relevant Medications   hydrochlorothiazide (HYDRODIURIL) 25 MG tablet   Stable angina (HCC)    He described substernal chest pressure that occurs when his body gets warm. On further questioning, it sounds like this is occurring with exertion, hence his body getting warm, although he had not previously tied those two concepts together. Pain occurs with exertion and improves within 10-15 minutes of rest. He reports that chest pain improves sometimes with albuterol use however he denies shortness of breath. Denies chest pain that occurs at rest. No active chest pain in the office today He has several cardiovascular risk factors including uncontrolled hypertension, hyperlipidemia, 80 pack year smoking history, history of TIA, and race.  Risk factor mitigation--blood pressure, cholesterol control, smoking cessation Continue aspirin 81mg , crestor resumed in the office today Referral to cardiology for ischemic eval Nitroglycerin tabs      Relevant Medications   rosuvastatin (CRESTOR) 20 MG tablet   nitroGLYCERIN (NITROSTAT) 0.4 MG SL tablet   Other Relevant Orders   Ambulatory referral to Cardiology     Other   History of TIA (transient ischemic attack) (Chronic)    Takes asprin 81mg  daily for antiplatelet therapy.      Hyperlipidemia (Chronic)  Previously prescribed crestor 20mg  however has not been taking it for several years. Resume crestor Lipid panel today      Healthcare maintenance (Chronic)    He reports having been given cologuard in the past but ultimately did not follow through with it. Discussed the importance of colon cancer screening and he has elected to proceed with colonoscopy. GI referral placed. Declines influenza, pneumonia, COVID 19, shingrix vaccinations.      Tobacco use disorder (Chronic)    ~80 pack year smoking history. Started smoking at age 54, smoked 2 packs per day for several years and recently cut back to one pack.  Based on  history and exam, suspect COPD. PFTs previously ordered in 2019 however does not appear that he followed through with this. Based on his other more pressing issues today, we did not discuss PFTs today.  He did not tolerate chantix due to it making him "jittery". Failed prior attempt with nicotine patches. We discussed consideration of trying wellbutrin in the future. He will need LDCT for lung cancer screening however, due to multiple other items addressed at today's visit, we will revisit this at a future visit.      Return in about 3 weeks (around 02/07/2021). -BP recheck with BMP  Pt discussed with Dr. 02/09/2021, MD Internal Medicine Resident PGY-3 Phyll[facility] 01/18/2021 7:09 AM

## 2021-01-18 NOTE — Assessment & Plan Note (Signed)
He described substernal chest pressure that occurs when his body gets warm. On further questioning, it sounds like this is occurring with exertion, hence his body getting warm, although he had not previously tied those two concepts together. Pain occurs with exertion and improves within 10-15 minutes of rest. He reports that chest pain improves sometimes with albuterol use however he denies shortness of breath. Denies chest pain that occurs at rest. No active chest pain in the office today He has several cardiovascular risk factors including uncontrolled hypertension, hyperlipidemia, 80 pack year smoking history, history of TIA, and race.   Risk factor mitigation--blood pressure, cholesterol control, smoking cessation  Continue aspirin 81mg , crestor resumed in the office today  Referral to cardiology for ischemic eval  Nitroglycerin tabs

## 2021-01-19 ENCOUNTER — Encounter: Payer: Self-pay | Admitting: Internal Medicine

## 2021-01-25 ENCOUNTER — Encounter: Payer: Self-pay | Admitting: Gastroenterology

## 2021-01-25 NOTE — Progress Notes (Signed)
Internal Medicine Clinic Attending  Case discussed with Dr. Christian  At the time of the visit.  We reviewed the resident's history and exam and pertinent patient test results.  I agree with the assessment, diagnosis, and plan of care documented in the resident's note.  

## 2021-01-31 ENCOUNTER — Telehealth: Payer: Self-pay | Admitting: *Deleted

## 2021-01-31 NOTE — Telephone Encounter (Signed)
Pt. Is direct colon for screening procedure 02/28/21 pre-visit-02/14/21 and cardiology appt.2/10 23,had a ECHO on 07/06/09,it it ok to proceed,please advise?

## 2021-01-31 NOTE — Telephone Encounter (Signed)
Please reschedule previsit and colonoscopy until after his cardiology evaluation.

## 2021-01-31 NOTE — Telephone Encounter (Signed)
Pt..notified that procedure and pre-visit needed to be change until after cardiology evaluation,encouraged pt. To inform cardiology of up coming procedure and to see if was ok to proceed,verbalized understanding.   Procedure rescheduled for 03/08/21 @230  pm and pre-visit 02/24/21 @ 3 pm.

## 2021-02-13 ENCOUNTER — Ambulatory Visit (INDEPENDENT_AMBULATORY_CARE_PROVIDER_SITE_OTHER): Payer: 59 | Admitting: Student

## 2021-02-13 ENCOUNTER — Encounter: Payer: Self-pay | Admitting: Student

## 2021-02-13 VITALS — BP 146/91 | HR 71 | Temp 98.6°F | Wt 182.8 lb

## 2021-02-13 DIAGNOSIS — I10 Essential (primary) hypertension: Secondary | ICD-10-CM

## 2021-02-13 DIAGNOSIS — F17200 Nicotine dependence, unspecified, uncomplicated: Secondary | ICD-10-CM

## 2021-02-13 DIAGNOSIS — Z122 Encounter for screening for malignant neoplasm of respiratory organs: Secondary | ICD-10-CM | POA: Insufficient documentation

## 2021-02-13 DIAGNOSIS — Z0000 Encounter for general adult medical examination without abnormal findings: Secondary | ICD-10-CM

## 2021-02-13 MED ORDER — LOSARTAN POTASSIUM 25 MG PO TABS: 25.0000 mg | ORAL_TABLET | Freq: Every day | ORAL | 2 refills | Status: DC

## 2021-02-13 NOTE — Assessment & Plan Note (Signed)
Patient with extensive tobacco use history, has a greater than 80-pack-year smoking history.  He currently still smokes although he has cut down to about half a pack a day.  He is not interested in quitting at this time.  Given extensive tobacco use history and current age, he is a candidate for lung cancer screening with low-dose CT.  Plan: -Follow-up low-dose CT

## 2021-02-13 NOTE — Progress Notes (Signed)
° °  CC: Follow-up of hypertension  HPI:  Mr.Armoni Seeling is a 56 y.o. male with history listed below presenting to the [facility] for follow-up of hypertension. Please see individualized problem based charting for full HPI.  Past Medical History:  Diagnosis Date   High cholesterol    Horseshoe kidney    Hypertension    Myocardial infarction [facility]) 2014   "they thought it was from taking Chantix" (07/16/2017)   Sinus headache    "all year round" (07/16/2017)   Small vessel disease ([place])    Stroke ([place]) 2013   denies residual on 07/16/2017    Review of Systems:  Negative aside from that listed in individualized problem based charting.  Physical Exam:  Vitals:   02/13/21 1543  BP: (!) 146/91  Pulse: 71  Temp: 98.6 F (37 C)  TempSrc: Oral  SpO2: 100%  Weight: 182 lb 12.8 oz (82.9 kg)   Physical Exam Constitutional:      Appearance: Normal appearance. He is not ill-appearing.  HENT:     Head: Normocephalic and atraumatic.     Mouth/Throat:     Mouth: Mucous membranes are moist.     Pharynx: Oropharynx is clear. No oropharyngeal exudate.  Eyes:     Extraocular Movements: Extraocular movements intact.     Conjunctiva/sclera: Conjunctivae normal.     Pupils: Pupils are equal, round, and reactive to light.  Cardiovascular:     Rate and Rhythm: Normal rate and regular rhythm.     Pulses: Normal pulses.     Heart sounds: Normal heart sounds. No murmur heard.   No gallop.  Pulmonary:     Effort: Pulmonary effort is normal.     Breath sounds: Normal breath sounds. No wheezing, rhonchi or rales.  Abdominal:     General: Bowel sounds are normal. There is no distension.     Palpations: Abdomen is soft.     Tenderness: There is no abdominal tenderness.  Musculoskeletal:        General: No swelling or tenderness. Normal range of motion.  Skin:    General: Skin is warm and dry.  Neurological:     General: No focal deficit present.     Mental Status: He is alert and oriented to  person, place, and time.  Psychiatric:        Mood and Affect: Mood normal.        Behavior: Behavior normal.     Assessment & Plan:   See Encounters Tab for problem based charting.  Patient discussed with Dr. Daryll Drown

## 2021-02-13 NOTE — Assessment & Plan Note (Signed)
Patient with extensive tobacco use history, greater than 80 pack years.  States that he used to smoke 2 packs a day, but now he is only smoking half a pack a day.  He has tried multiple different tools for smoking cessation including Chantix, Nicorette gum, nicotine patch but none of these have helped in the past.  Advised that there are other options that we could trial, but patient states that he is not interested in quitting at this time.    Plan: -Continue encouraging smoking cessation at subsequent visits -Low-dose CT for lung cancer screening

## 2021-02-13 NOTE — Assessment & Plan Note (Signed)
Patient with history of hypertension, recently restarted on hydrochlorothiazide 25 mg daily.  States that he has been adherent to this medication.  Today, blood pressure 146/91.  Still elevated despite initiation of HCTZ although it is better controlled than during last office visit.  He is agreeable to starting another antihypertensive to better control his blood pressure.  Agreed to start low-dose Cozaar.  If blood pressure not well controlled with Cozaar 25 mg daily and HCTZ, can increase dose of Cozaar to 50 mg and combine with HCTZ (Hyzaar).  Plan: -Continue HCTZ 25 mg daily -Start Cozaar 25 mg daily -Follow-up in 1 month, repeat BMP at that time

## 2021-02-13 NOTE — Assessment & Plan Note (Signed)
Colonoscopy for colon cancer screening scheduled for March 08, 2021.

## 2021-02-13 NOTE — Patient Instructions (Signed)
[name],  It was a pleasure seeing you in the clinic today.   Your blood pressure was high today. I have prescribed a new medication called losartan (also called cozaar) to help better control your blood pressure.  I have ordered a lung imaging test to make sure there is nothing abnormal seen. Someone will call you to schedule this for you. Please come back to see [name] in 1 month for follow up of your blood pressure.  Please call our clinic at [phone] if you have any questions or concerns. The best time to call is Monday-Friday from 9am-4pm, but there is someone available 24/7 at the same number. If you need medication refills, please notify your pharmacy one week in advance and they will send [name] a request.   Thank you for letting us take part in your care. We look forward to seeing you next time!

## 2021-02-16 NOTE — Progress Notes (Signed)
Internal Medicine Clinic Attending  Case discussed with Dr. Jinwala at the time of the visit.  We reviewed the resident's history and exam and pertinent patient test results.  I agree with the assessment, diagnosis, and plan of care documented in the resident's note.  

## 2021-02-17 ENCOUNTER — Encounter: Payer: Self-pay | Admitting: Internal Medicine

## 2021-02-17 ENCOUNTER — Ambulatory Visit (INDEPENDENT_AMBULATORY_CARE_PROVIDER_SITE_OTHER): Payer: 59 | Admitting: Internal Medicine

## 2021-02-17 ENCOUNTER — Other Ambulatory Visit: Payer: Self-pay

## 2021-02-17 VITALS — BP 132/74 | HR 104 | Ht 71.0 in | Wt 181.0 lb

## 2021-02-17 DIAGNOSIS — I10 Essential (primary) hypertension: Secondary | ICD-10-CM

## 2021-02-17 DIAGNOSIS — E785 Hyperlipidemia, unspecified: Secondary | ICD-10-CM

## 2021-02-17 DIAGNOSIS — R072 Precordial pain: Secondary | ICD-10-CM

## 2021-02-17 DIAGNOSIS — R079 Chest pain, unspecified: Secondary | ICD-10-CM | POA: Diagnosis not present

## 2021-02-17 DIAGNOSIS — F17200 Nicotine dependence, unspecified, uncomplicated: Secondary | ICD-10-CM | POA: Diagnosis not present

## 2021-02-17 MED ORDER — METOPROLOL TARTRATE 100 MG PO TABS: ORAL_TABLET | ORAL | 0 refills | Status: DC

## 2021-02-17 NOTE — Patient Instructions (Addendum)
Medication Instructions:  Your physician recommends that you continue on your current medications as directed. Please refer to the Current Medication list given to you today.  *If you need a refill on your cardiac medications before your next appointment, please call your pharmacy*   Lab Work: Lab work to be done today--BMP If you have labs (blood work) drawn today and your tests are completely normal, you will receive your results only by: MyChart Message (if you have MyChart) OR A paper copy in the mail If you have any lab test that is abnormal or we need to change your treatment, we will call you to review the results.   Testing/Procedures: Your physician has requested that you have an echocardiogram. Echocardiography is a painless test that uses sound waves to create images of your heart. It provides your doctor with information about the size and shape of your heart and how well your hearts chambers and valves are working. This procedure takes approximately one hour. There are no restrictions for this procedure.  Your physician has requested that you have cardiac CT. Cardiac computed tomography (CT) is a painless test that uses an x-ray machine to take clear, detailed pictures of your heart. For further information please visit [email or web address] Please follow instruction sheet as given.     Follow-Up: At [facility], you and your health needs are our priority.  As part of our continuing mission to provide you with exceptional heart care, we have created designated Provider Care Teams.  These Care Teams include your primary Cardiologist (physician) and Advanced Practice Providers (APPs -  Physician Assistants and Nurse Practitioners) who all work together to provide you with the care you need, when you need it.  We recommend signing up for the patient portal called "MyChart".  Sign up information is provided on this After Visit Summary.  MyChart is used to connect with patients  for Virtual Visits (Telemedicine).  Patients are able to view lab/test results, encounter notes, upcoming appointments, etc.  Non-urgent messages can be sent to your provider as well.   To learn more about what you can do with MyChart, go to [email or web address].    Your next appointment:   As needed  The format for your next appointment:   In Person  Provider:   Orbie Pyo, MD     Other Instructions   Your cardiac CT will be scheduled at one of the below locations:   [facility] [address] [place], [place] [address] [phone]  OR  [facility] [address] [place], [place] [address] [phone]  If scheduled at [facility], please arrive at the [facility] main entrance (entrance A) of [facility] 30 minutes prior to test start time. You can use the FREE valet parking offered at the main entrance (encouraged to control the heart rate for the test) Proceed to the [facility] Radiology Department (first floor) to check-in and test prep.  If scheduled at [facility], please arrive 15 mins early for check-in and test prep.  Please follow these instructions carefully (unless otherwise directed):  Hold all erectile dysfunction medications at least 3 days (72 hrs) prior to test.  On the Night Before the Test: Be sure to Drink plenty of water. Do not consume any caffeinated/decaffeinated beverages or chocolate 12 hours prior to your test. Do not take any antihistamines 12 hours prior to your test. On the Day of the Test: Drink plenty  of water until 1 hour prior to the test. Do not eat any food 4 hours prior to the test. You may take your regular medications prior to the test.  Take metoprolol (Lopressor) two hours prior to test. HOLD Furosemide/Hydrochlorothiazide morning of the test. FEMALES- please wear underwire-free bra if available, avoid dresses & tight  clothing        After the Test: Drink plenty of water. After receiving IV contrast, you may experience a mild flushed feeling. This is normal. On occasion, you may experience a mild rash up to 24 hours after the test. This is not dangerous. If this occurs, you can take Benadryl 25 mg and increase your fluid intake. If you experience trouble breathing, this can be serious. If it is severe call 911 IMMEDIATELY. If it is mild, please call our office. If you take any of these medications: Glipizide/Metformin, Avandament, Glucavance, please do not take 48 hours after completing test unless otherwise instructed.  We will call to schedule your test 2-4 weeks out understanding that some insurance companies will need an authorization prior to the service being performed.   For non-scheduling related questions, please contact the cardiac imaging nurse navigator should you have any questions/concerns: Rockwell Alexandria, Cardiac Imaging Nurse Navigator Larey Brick, Cardiac Imaging Nurse Navigator [facility] Direct Office Dial: [phone]   For scheduling needs, including cancellations and rescheduling, please call [place], [phone].

## 2021-02-17 NOTE — Progress Notes (Signed)
Cardiology Office Note:    Date:  02/17/2021   ID:  Jesse Sanders, Jesse Sanders 04-29-1965, MRN [id number]  PCP:  [name], MD   [facility] Providers Cardiologist:  Alverda Skeans, MD Referring MD: Mercie Eon, MD   Chief Complaint/Reason for Referral: Chest pain  ASSESSMENT:    Chest pain, unspecified type - Plan: EKG 12-Lead  Primary hypertension  Hyperlipidemia, unspecified hyperlipidemia type  Tobacco use disorder    PLAN:    In order of problems listed above:   We will obtain a coronary CTA and echocardiogram to evaluate further.  If the patient has mild obstructive coronary artery disease, they will require a statin (with goal LDL < 70) and aspirin, if they have high-grade disease we will need to consider optimal medical therapy and if symptoms are refractory to medical therapy, then a cardiac catheterization with possible PCI will be pursued to alleviate symptoms.  If they have high risk disease we will proceed directly to cardiac catheterization.  We will keep follow-up with me open-ended depending on the results of this testing.  He does have a colonoscopy upcoming which is a low risk procedure.  Blood pressures well controlled. This is being followed by the patient's primary care provider. I reiterated the need to abstain from tobacco.              Dispo:  No follow-ups on file.     Medication Adjustments/Labs and Tests Ordered: Current medicines are reviewed at length with the patient today.  Concerns regarding medicines are outlined above.   Tests Ordered: Orders Placed This Encounter  Procedures   EKG 12-Lead    Medication Changes: No orders of the defined types were placed in this encounter.   History of Present Illness:    FOCUSED CARDIOVASCULAR PROBLEM LIST:   1.  Hypertension 2.  Hyperlipidemia 3.  History of stroke 4.  Tobacco abuse 5.  Calcium score of 0 2019  The patient is a 56 y.o. male with the indicated medical history  here for chest pain.  The patient tells me that when it gets hot he will get chest pain.  It sometimes associate with shortness of breath.  It has not happened in about 8 months.  It happened when he was walking up to his attic carrying something.  He denies any palpitations, Paraschos nocturnal dyspnea, orthopnea, signs or symptoms stroke, or severe bleeding.  He is required no emergency room visits or hospitalizations.     Of note he did have a calcium score of 0 in 2019 suggesting low risk for cardiovascular events.        Previous Medical History: Past Medical History:  Diagnosis Date   High cholesterol    Horseshoe kidney    Hypertension    Myocardial infarction [facility]) 2014   "they thought it was from taking Chantix" (07/16/2017)   Sinus headache    "all year round" (07/16/2017)   Small vessel disease (HCC)    Stroke (HCC) 2013   denies residual on 07/16/2017     Current Medications: Current Meds  Medication Sig   albuterol (VENTOLIN HFA) 108 (90 Base) MCG/ACT inhaler Inhale 1-2 puffs into the lungs every 6 (six) hours as needed for wheezing or shortness of breath.   aspirin EC 81 MG tablet Take 1 tablet (81 mg total) by mouth daily.   cetirizine (ZYRTEC) 10 MG tablet TAKE 1 TABLET BY MOUTH EVERY DAY   cetirizine-pseudoephedrine (ZYRTEC-D) 5-120 MG tablet Take 1 tablet by mouth 2 (  two) times daily.   hydrochlorothiazide (HYDRODIURIL) 25 MG tablet Take 1 tablet (25 mg total) by mouth daily.   losartan (COZAAR) 25 MG tablet Take 1 tablet (25 mg total) by mouth daily.   montelukast (SINGULAIR) 10 MG tablet Take 1 tablet (10 mg total) by mouth at bedtime.   nitroGLYCERIN (NITROSTAT) 0.4 MG SL tablet Place 1 tablet (0.4 mg total) under the tongue every 5 (five) minutes as needed for chest pain. Place 1 tablet (0.4mg ) under the tongue every 5 minutes as needed for chest pain. If you require more than 2 doses, seen urgent evaluation.   rosuvastatin (CRESTOR) 20 MG tablet Take 1 tablet (20 mg  total) by mouth daily.     Allergies:    Aleve [naproxen sodium]   Social History:   Social History   Tobacco Use   Smoking status: Every Day    Packs/day: 0.50    Years: 39.00    Pack years: 19.50    Types: Cigarettes   Smokeless tobacco: Never   Tobacco comments:    1 pack per day   Vaping Use   Vaping Use: Never used  Substance Use Topics   Alcohol use: Not Currently    Comment: 07/16/2017 "couple drinks/month"   Drug use: Never     Family Hx: Family History  Problem Relation Age of Onset   Kidney failure Father        Due to "blood poisoning"   Heart attack Father        Onset in his 32s with subsequent CABG   Stroke Father    Breast cancer Mother    Heart disease Mother    Lung cancer Maternal Uncle    Asthma Sister      Review of Systems:   Please see the history of present illness.    All other systems reviewed and are negative.     EKGs/Labs/Other Test Reviewed:    EKG: Sinus tachycardia with LVH  Prior CV studies:  Calcium score 2019 1. Coronary artery calcium score 0 Agatston units, suggesting low risk for future cardiac events. 2.  No significant coronary disease noted.   Imaging studies that I have independently reviewed today: Calcium score  Recent Labs: 01/17/2021: BUN 10; Creatinine, Ser 0.94; Potassium 4.4; Sodium 140   Recent Lipid Panel Lab Results  Component Value Date/Time   CHOL 254 (H) 01/17/2021 02:08 PM   TRIG 121 01/17/2021 02:08 PM   HDL 45 01/17/2021 02:08 PM   LDLCALC 187 (H) 01/17/2021 02:08 PM    Risk Assessment/Calculations:          Physical Exam:    VS:  BP 132/74    Pulse (!) 104    Ht 5\' 11"  (1.803 m)    Wt 181 lb (82.1 kg)    SpO2 97%    BMI 25.24 kg/m    Wt Readings from Last 3 Encounters:  02/17/21 181 lb (82.1 kg)  02/13/21 182 lb 12.8 oz (82.9 kg)  01/17/21 179 lb 1.6 oz (81.2 kg)    GENERAL:  No apparent distress, AOx3 HEENT:  No carotid bruits, +2 carotid impulses, no scleral icterus CAR:  RRR no murmurs, gallops, rubs, or thrills RES:  Clear to auscultation bilaterally ABD:  Soft, nontender, nondistended, positive bowel sounds x 4 VASC:  +2 radial pulses, +2 carotid pulses, palpable pedal pulses NEURO:  CN 2-12 grossly intact; motor and sensory grossly intact PSYCH:  No active depression or anxiety EXT:  No edema, ecchymosis, or  cyanosis  Signed, Orbie Pyo, MD  02/17/2021 4:08 PM    [facility] [address] [place], [place], [place]  [address] Phone: [phone]; Fax: [phone]   Note:  This document was prepared using Dragon voice recognition software and may include unintentional dictation errors.

## 2021-02-18 LAB — BASIC METABOLIC PANEL
BUN/Creatinine Ratio: 14 (ref 9–20)
BUN: 15 mg/dL (ref 6–24)
CO2: 22 mmol/L (ref 20–29)
Calcium: 9.6 mg/dL (ref 8.7–10.2)
Chloride: 97 mmol/L (ref 96–106)
Creatinine, Ser: 1.1 mg/dL (ref 0.76–1.27)
Glucose: 86 mg/dL (ref 70–99)
Potassium: 4.2 mmol/L (ref 3.5–5.2)
Sodium: 136 mmol/L (ref 134–144)
eGFR: 80 mL/min/{1.73_m2} (ref >59)

## 2021-02-24 ENCOUNTER — Ambulatory Visit (AMBULATORY_SURGERY_CENTER): Payer: Self-pay

## 2021-02-24 ENCOUNTER — Other Ambulatory Visit: Payer: Self-pay

## 2021-02-24 VITALS — Ht 71.0 in | Wt 182.0 lb

## 2021-02-24 DIAGNOSIS — Z1211 Encounter for screening for malignant neoplasm of colon: Secondary | ICD-10-CM

## 2021-02-24 MED ORDER — NA SULFATE-K SULFATE-MG SULF 17.5-3.13-1.6 GM/177ML PO SOLN: 1.0000 | Freq: Once | ORAL | 0 refills | Status: AC

## 2021-02-24 NOTE — Progress Notes (Signed)
Denies allergies to eggs or soy products. Denies complication of anesthesia or sedation. Denies use of weight loss medication. Denies use of O2.   Emmi instructions given for colonoscopy.  

## 2021-02-27 ENCOUNTER — Ambulatory Visit (HOSPITAL_COMMUNITY)
Admission: RE | Admit: 2021-02-27 | Discharge: 2021-02-27 | Disposition: A | Discharge status: Home / Self Care | Payer: 59 | Source: Ambulatory Visit | Attending: Internal Medicine | Admitting: Internal Medicine

## 2021-02-27 ENCOUNTER — Other Ambulatory Visit: Payer: Self-pay

## 2021-02-27 DIAGNOSIS — F17200 Nicotine dependence, unspecified, uncomplicated: Secondary | ICD-10-CM | POA: Insufficient documentation

## 2021-02-28 ENCOUNTER — Encounter: Payer: 59 | Admitting: Gastroenterology

## 2021-03-01 ENCOUNTER — Telehealth (HOSPITAL_COMMUNITY): Payer: Self-pay | Admitting: *Deleted

## 2021-03-01 NOTE — Assessment & Plan Note (Signed)
Low dose CT scan with no abnormalities. Recommend annual screening with low dose CT scan given smoking history/current smoker.  Plan: -repeat low dose CT scan in 1 year

## 2021-03-01 NOTE — Telephone Encounter (Signed)
Attempted to call patient regarding upcoming cardiac CT appointment. °Left message on voicemail with name and callback number ° °Marisue Canion RN Navigator Cardiac Imaging °[facility] °[phone] Office °[phone] Cell ° °

## 2021-03-02 ENCOUNTER — Other Ambulatory Visit: Payer: Self-pay

## 2021-03-02 ENCOUNTER — Ambulatory Visit (HOSPITAL_COMMUNITY)
Admission: RE | Admit: 2021-03-02 | Discharge: 2021-03-02 | Disposition: A | Discharge status: Home / Self Care | Payer: 59 | Source: Ambulatory Visit | Attending: Internal Medicine | Admitting: Internal Medicine

## 2021-03-02 DIAGNOSIS — R072 Precordial pain: Secondary | ICD-10-CM

## 2021-03-02 MED ORDER — IOHEXOL 350 MG/ML SOLN
95.0000 mL | Freq: Once | INTRAVENOUS | Status: AC | PRN
  Administered 2021-03-02: 95 mL via INTRAVENOUS

## 2021-03-02 MED ORDER — METOPROLOL TARTRATE 5 MG/5ML IV SOLN
INTRAVENOUS | Status: AC
  Administered 2021-03-02: 10 mg via INTRAVENOUS
  Filled 2021-03-02: qty 20

## 2021-03-02 MED ORDER — METOPROLOL TARTRATE 5 MG/5ML IV SOLN
10.0000 mg | INTRAVENOUS | Status: DC | PRN
  Administered 2021-03-02: 10 mg via INTRAVENOUS

## 2021-03-02 MED ORDER — NITROGLYCERIN 0.4 MG SL SUBL
SUBLINGUAL_TABLET | SUBLINGUAL | Status: AC
  Filled 2021-03-02: qty 2

## 2021-03-02 MED ORDER — DILTIAZEM HCL 25 MG/5ML IV SOLN
INTRAVENOUS | Status: AC
  Administered 2021-03-02: 10 mg via INTRAVENOUS
  Filled 2021-03-02: qty 5

## 2021-03-02 MED ORDER — DILTIAZEM HCL 25 MG/5ML IV SOLN: 10.0000 mg | INTRAVENOUS | Status: DC | PRN

## 2021-03-02 MED ORDER — NITROGLYCERIN 0.4 MG SL SUBL
0.8000 mg | SUBLINGUAL_TABLET | Freq: Once | SUBLINGUAL | Status: AC
  Administered 2021-03-02: 0.8 mg via SUBLINGUAL

## 2021-03-03 ENCOUNTER — Telehealth: Payer: Self-pay

## 2021-03-03 ENCOUNTER — Ambulatory Visit (HOSPITAL_COMMUNITY): Payer: 59

## 2021-03-03 NOTE — Telephone Encounter (Signed)
-----   Message from Meryl Dare, MD sent at 03/03/2021 12:08 PM EST ----- Regarding: FW: Please find a later date for this patients procedure. Thanks. ----- Message ----- From: Cathlyn Parsons, CRNA Sent: 03/03/2021  11:51 AM EST To: Meryl Dare, MD  Dr. Russella Dar,  This pt is scheduled with you on March 1.  On 02/17/21 he was seen by [facility] for c/o CP; their w/u has not been completed as yet.  His procedure at [facility] will need to be delayed until cleared by cardiology.  Thanks,  Cathlyn Parsons

## 2021-03-03 NOTE — Telephone Encounter (Signed)
Left message for patient to call back  

## 2021-03-06 NOTE — Telephone Encounter (Signed)
Patient has been rescheduled from screening colonoscopy on 03/23/21 at 4:00 pm d/t waiting on cardiac clearance. Pt verbalized understanding, no further questions.

## 2021-03-08 ENCOUNTER — Encounter: Payer: 59 | Admitting: Gastroenterology

## 2021-03-10 ENCOUNTER — Ambulatory Visit (HOSPITAL_COMMUNITY): Payer: 59 | Attending: Cardiology

## 2021-03-10 ENCOUNTER — Other Ambulatory Visit: Payer: Self-pay

## 2021-03-10 DIAGNOSIS — I10 Essential (primary) hypertension: Secondary | ICD-10-CM | POA: Insufficient documentation

## 2021-03-10 DIAGNOSIS — R072 Precordial pain: Secondary | ICD-10-CM | POA: Insufficient documentation

## 2021-03-10 DIAGNOSIS — R079 Chest pain, unspecified: Secondary | ICD-10-CM | POA: Insufficient documentation

## 2021-03-10 LAB — ECHOCARDIOGRAM COMPLETE
Area-P 1/2: 3.37 cm2
S' Lateral: 3.2 cm

## 2021-03-20 ENCOUNTER — Telehealth: Payer: Self-pay

## 2021-03-20 NOTE — Telephone Encounter (Signed)
-----   Message from Early Osmond, MD sent at 03/18/2021  8:20 AM EST ----- ?Let him know his CTA shows no blockages in heart arteries, needs to f/u with PCP about noncardiac chest pain ? ?----- Message ----- ?From: Eli Hose, RN ?Sent: 03/17/2021   5:23 PM EST ?To: Early Osmond, MD ? ?Here's the CCTA read. ? ?Merle ? ?

## 2021-03-20 NOTE — Telephone Encounter (Signed)
Patient made aware of results and recommendations to follow up with PCP for non cardiac chest pain. ?

## 2021-03-23 ENCOUNTER — Ambulatory Visit (AMBULATORY_SURGERY_CENTER): Payer: 59 | Admitting: Gastroenterology

## 2021-03-23 ENCOUNTER — Encounter: Payer: Self-pay | Admitting: Gastroenterology

## 2021-03-23 VITALS — BP 123/87 | HR 67 | Temp 98.4°F | Resp 15 | Ht 71.0 in | Wt 182.0 lb

## 2021-03-23 DIAGNOSIS — Z1211 Encounter for screening for malignant neoplasm of colon: Secondary | ICD-10-CM

## 2021-03-23 MED ORDER — SODIUM CHLORIDE 0.9 % IV SOLN: 500.0000 mL | Freq: Once | INTRAVENOUS | Status: DC

## 2021-03-23 NOTE — Patient Instructions (Signed)
Discharge instructions given. °Handouts on Diverticulosis and Hemorrhoids. °Resume previous medications. °YOU HAD AN ENDOSCOPIC PROCEDURE TODAY AT THE [facility]:   Refer to the procedure report that was given to you for any specific questions about what was found during the examination.  If the procedure report does not answer your questions, please call your gastroenterologist to clarify.  If you requested that your care partner not be given the details of your procedure findings, then the procedure report has been included in a sealed envelope for you to review at your convenience later. ° °YOU SHOULD EXPECT: Some feelings of bloating in the abdomen. Passage of more gas than usual.  Walking can help get rid of the air that was put into your GI tract during the procedure and reduce the bloating. If you had a lower endoscopy (such as a colonoscopy or flexible sigmoidoscopy) you may notice spotting of blood in your stool or on the toilet paper. If you underwent a bowel prep for your procedure, you may not have a normal bowel movement for a few days. ° °Please Note:  You might notice some irritation and congestion in your nose or some drainage.  This is from the oxygen used during your procedure.  There is no need for concern and it should clear up in a day or so. ° °SYMPTOMS TO REPORT IMMEDIATELY: ° °Following lower endoscopy (colonoscopy or flexible sigmoidoscopy): ° Excessive amounts of blood in the stool ° Significant tenderness or worsening of abdominal pains ° Swelling of the abdomen that is new, acute ° Fever of 100°F or higher ° ° °For urgent or emergent issues, a gastroenterologist can be reached at any hour by calling [phone]. °Do not use MyChart messaging for urgent concerns.  ° ° °DIET:  We do recommend a small meal at first, but then you may proceed to your regular diet.  Drink plenty of fluids but you should avoid alcoholic beverages for 24 hours. ° °ACTIVITY:  You should plan to  take it easy for the rest of today and you should NOT DRIVE or use heavy machinery until tomorrow (because of the sedation medicines used during the test).   ° °FOLLOW UP: °Our staff will call the number listed on your records 48-72 hours following your procedure to check on you and address any questions or concerns that you may have regarding the information given to you following your procedure. If we do not reach you, we will leave a message.  We will attempt to reach you two times.  During this call, we will ask if you have developed any symptoms of COVID 19. If you develop any symptoms (ie: fever, flu-like symptoms, shortness of breath, cough etc.) before then, please call [phone].  If you test positive for Covid 19 in the 2 weeks post procedure, please call and report this information to us.   ° °If any biopsies were taken you will be contacted by phone or by letter within the next 1-3 weeks.  Please call us at [phone] if you have not heard about the biopsies in 3 weeks.  ° ° °SIGNATURES/CONFIDENTIALITY: °You and/or your care partner have signed paperwork which will be entered into your electronic medical record.  These signatures attest to the fact that that the information above on your After Visit Summary has been reviewed and is understood.  Full responsibility of the confidentiality of this discharge information lies with you and/or your care-partner.  °

## 2021-03-23 NOTE — Op Note (Signed)
[facility] ?Patient Name: Jesse Sanders ?Procedure Date: 03/23/2021 3:51 PM ?MRN: [id number] ?Endoscopist: Meryl Dare , MD ?Age: 56 ?Referring MD:  ?Date of Birth: 1965-03-21 ?Gender: Male ?Account #: [id number] ?Procedure:                Colonoscopy ?Indications:              Screening for colorectal malignant neoplasm ?Medicines:                Monitored Anesthesia Care ?Procedure:                Pre-Anesthesia Assessment: ?                          - Prior to the procedure, a History and Physical  ?                          was performed, and patient medications and  ?                          allergies were reviewed. The patient's tolerance of  ?                          previous anesthesia was also reviewed. The risks  ?                          and benefits of the procedure and the sedation  ?                          options and risks were discussed with the patient.  ?                          All questions were answered, and informed consent  ?                          was obtained. Prior Anticoagulants: The patient has  ?                          taken no previous anticoagulant or antiplatelet  ?                          agents. ASA Grade Assessment: II - A patient with  ?                          mild systemic disease. After reviewing the risks  ?                          and benefits, the patient was deemed in  ?                          satisfactory condition to undergo the procedure. ?                          After obtaining informed consent, the colonoscope  ?  was passed under direct vision. Throughout the  ?                          procedure, the patient's blood pressure, pulse, and  ?                          oxygen saturations were monitored continuously. The  ?                          Colonoscope was introduced through the anus and  ?                          advanced to the the cecum, identified by  ?                          appendiceal orifice and  ileocecal valve. The  ?                          ileocecal valve, appendiceal orifice, and rectum  ?                          were photographed. The quality of the bowel  ?                          preparation was fair. The colonoscopy was performed  ?                          without difficulty. The patient tolerated the  ?                          procedure well. ?Scope In: 4:06:09 PM ?Scope Out: 4:23:53 PM ?Scope Withdrawal Time: 0 hours 15 minutes 0 seconds  ?Total Procedure Duration: 0 hours 17 minutes 44 seconds  ?Findings:                 The perianal and digital rectal examinations were  ?                          normal. ?                          A moderate amount of stool was found in the entire  ?                          colon, interfering with visualization. Lavage of  ?                          the area was performed using copious amounts of tap  ?                          water, resulting in clearance with fair  ?                          visualization. ?  A few small-mouthed diverticula were found in the  ?                          left colon. There was no evidence of diverticular  ?                          bleeding. ?                          Internal hemorrhoids were found during  ?                          retroflexion. The hemorrhoids were moderate and  ?                          Grade I (internal hemorrhoids that do not prolapse). ?                          The exam was otherwise without abnormality on  ?                          direct and retroflexion views. ?Complications:            No immediate complications. Estimated blood loss:  ?                          None. ?Estimated Blood Loss:     Estimated blood loss: none. ?Impression:               - Preparation of the colon was fair. ?                          - Stool in the entire examined colon. ?                          - Mild diverticulosis in the left colon. ?                          - Internal hemorrhoids. ?                           - The examination was otherwise normal on direct  ?                          and retroflexion views. ?                          - No specimens collected. ?Recommendation:           - Repeat colonoscopy in 1 year for screening  ?                          purposes due the inadequate prep with a more  ?                          extensive bowel prep. ?                          -  Patient has a contact number available for  ?                          emergencies. The signs and symptoms of potential  ?                          delayed complications were discussed with the  ?                          patient. Return to normal activities tomorrow.  ?                          Written discharge instructions were provided to the  ?                          patient. ?                          - High fiber diet. ?                          - Continue present medications. ?Meryl Dare, MD ?03/23/2021 4:28:27 PM ?This report has been signed electronically. ?

## 2021-03-23 NOTE — Progress Notes (Signed)
Pt. Reports no change in his medical or surgical history since his pre-visit 02/24/2021. ?
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
## 2021-03-23 NOTE — Progress Notes (Signed)
Report to PACU, RN, vss, BBS= Clear.  

## 2021-03-27 ENCOUNTER — Telehealth: Payer: Self-pay | Admitting: *Deleted

## 2021-03-27 ENCOUNTER — Telehealth: Payer: Self-pay

## 2021-03-27 NOTE — Telephone Encounter (Signed)
Attempted to call patient for their post-procedure follow-up call. No answer and unable to leave voicemail.Will try to call patient back again around lunch time today. ? ?

## 2021-03-27 NOTE — Telephone Encounter (Signed)
Left message on follow up call. 

## 2021-04-15 ENCOUNTER — Other Ambulatory Visit: Payer: Self-pay | Admitting: Internal Medicine

## 2021-04-15 DIAGNOSIS — I10 Essential (primary) hypertension: Secondary | ICD-10-CM

## 2021-04-26 ENCOUNTER — Encounter: Payer: Self-pay | Admitting: Internal Medicine

## 2021-04-26 ENCOUNTER — Encounter: Payer: 59 | Admitting: Internal Medicine

## 2021-05-12 ENCOUNTER — Encounter: Payer: Self-pay | Admitting: Internal Medicine

## 2021-05-12 ENCOUNTER — Ambulatory Visit (INDEPENDENT_AMBULATORY_CARE_PROVIDER_SITE_OTHER): Payer: 59 | Admitting: Internal Medicine

## 2021-05-12 VITALS — BP 147/92 | HR 88 | Temp 98.5°F | Ht 71.0 in | Wt 183.2 lb

## 2021-05-12 DIAGNOSIS — G4733 Obstructive sleep apnea (adult) (pediatric): Secondary | ICD-10-CM

## 2021-05-12 DIAGNOSIS — I10 Essential (primary) hypertension: Secondary | ICD-10-CM | POA: Diagnosis not present

## 2021-05-12 DIAGNOSIS — M5412 Radiculopathy, cervical region: Secondary | ICD-10-CM | POA: Diagnosis not present

## 2021-05-12 DIAGNOSIS — G4730 Sleep apnea, unspecified: Secondary | ICD-10-CM | POA: Diagnosis not present

## 2021-05-12 MED ORDER — ROSUVASTATIN CALCIUM 20 MG PO TABS: 20.0000 mg | ORAL_TABLET | Freq: Every day | ORAL | 1 refills | Status: DC

## 2021-05-12 MED ORDER — LOSARTAN POTASSIUM 50 MG PO TABS
50.0000 mg | ORAL_TABLET | Freq: Every day | ORAL | 0 refills | Status: DC
Start: 2021-05-12 — End: 2021-06-06

## 2021-05-12 NOTE — Patient Instructions (Addendum)
Today we discussed:  ? ?Shoulder pain ?-I have ordered an MRI of your neck to evaluate for nerve compression from your degenerative disks. ? ?Hypertension: Your blood pressure remains elevated today.  ?-Continue taking the hydrochlorothiazide (HCTZ).  ?-INCREASE the losartan to 50mg  (2 tablets) until you pick up the new prescription.  ?-Follow up in 2 weeks for a blood pressure recheck ? ?High cholesterol ?-Pick up Crestor at your pharmacy ? ?Sleep apnea ?-I have placed a referral for a sleep study for further evaluation. ?

## 2021-05-13 LAB — BASIC METABOLIC PANEL
BUN/Creatinine Ratio: 19 (ref 9–20)
BUN: 20 mg/dL (ref 6–24)
CO2: 24 mmol/L (ref 20–29)
Calcium: 10.6 mg/dL — ABNORMAL HIGH (ref 8.7–10.2)
Chloride: 98 mmol/L (ref 96–106)
Creatinine, Ser: 1.05 mg/dL (ref 0.76–1.27)
Glucose: 91 mg/dL (ref 70–99)
Potassium: 4.4 mmol/L (ref 3.5–5.2)
Sodium: 136 mmol/L (ref 134–144)
eGFR: 84 mL/min/{1.73_m2} (ref >59)

## 2021-05-14 ENCOUNTER — Encounter: Payer: Self-pay | Admitting: Internal Medicine

## 2021-05-14 ENCOUNTER — Other Ambulatory Visit: Payer: Self-pay | Admitting: Student

## 2021-05-14 DIAGNOSIS — G4733 Obstructive sleep apnea (adult) (pediatric): Secondary | ICD-10-CM | POA: Insufficient documentation

## 2021-05-14 NOTE — Progress Notes (Signed)
? ?  Office Visit ? ? Patient ID: Jesse Sanders, male    DOB: November 15, 1965, 56 y.o.   MRN: [id number]   PCP: [name], MD  ? ?Subjective:  ?CC: Shoulder Pain (Left shoulder pain for about 3 months has been taking Asprin and tylenol to relieve the pain)  ? ?Jesse Sanders is a 56 y.o. year old male who presents for the above medical condition(s). Please refer to problem based charting for assessment and plan. ? ? ? ?Objective:  ? ?BP (!) 147/92 (BP Location: Right Arm, Patient Position: Sitting, Cuff Size: Normal)   Pulse 88   Temp 98.5 ?F (36.9 ?C) (Oral)   Ht 5\' 11"  (1.803 m)   Wt 183 lb 3.2 oz (83.1 kg)   SpO2 100%   BMI 25.55 kg/m?  ? ?Assessment & Plan:  ? ? ? HTN (hypertension) (Chronic)  ?  Current medications: Hydrochlorothiazide 25 mg daily, losartan 25 mg daily. ?He reports medication adherence, congruent with dispense report.  Denies adverse medication effects. ?Blood pressure remains above goal in the office today.  147/92. ?BMP shows mildly elevated calcium of 10.6.  Suspect this is attributable to hydrochlorothiazide since it was normal prior to initiation. ?Plan ?- Increase losartan to 50 mg daily.  Continue hydrochlorothiazide ?- Follow-up in 2 weeks for blood pressure recheck ?  ?  ? Suspected OSA  ?  Stop bang score is 6 indicating high risk for moderate to severe OSA.  Received points for snoring, observed apnea, daytime fatigue, hypertension, gender, age. ?- Referred for sleep study testing ?  ?  ? Cervical radiculopathy  ?  He reports worsening of his left cervical radiculopathy symptoms.  He reports increased numbness down his left arm extending from shoulder to his fingers.  I reviewed his prior MRI which revealed multilevel disc disease.  We discussed work-up and treatment options.  He would like to proceed with MRI which, depending on results, may have resulted in referral to neurosurgery. ? ?  ?  ? Relevant Orders  ? MR Cervical Spine Wo Contrast  ? ? ?Return in about 2 weeks  (around 05/26/2021) for Blood pressure follow up. ? ? ?Pt discussed with Dr. 05/28/2021 ? ?Sol Blazing, MD ?Internal Medicine Resident PGY-3 ?[name] ?05/14/2021 10:48 AM  ?  ?

## 2021-05-14 NOTE — Assessment & Plan Note (Signed)
He reports worsening of his left cervical radiculopathy symptoms.  He reports increased numbness down his left arm extending from shoulder to his fingers.  I reviewed his prior MRI which revealed multilevel disc disease.  We discussed work-up and treatment options.  He would like to proceed with MRI which, depending on results, may have resulted in referral to neurosurgery. ?

## 2021-05-14 NOTE — Assessment & Plan Note (Addendum)
Current medications: Hydrochlorothiazide 25 mg daily, losartan 25 mg daily. ?He reports medication adherence, congruent with dispense report.  Denies adverse medication effects. ?Blood pressure remains above goal in the office today.  147/92. ?BMP shows mildly elevated calcium of 10.6.  Suspect this is attributable to hydrochlorothiazide since it was normal prior to initiation. ?Plan ?- Increase losartan to 50 mg daily.  Continue hydrochlorothiazide ?- Follow-up in 2 weeks for blood pressure recheck ? ?

## 2021-05-14 NOTE — Assessment & Plan Note (Signed)
Stop bang score is 6 indicating high risk for moderate to severe OSA.  Received points for snoring, observed apnea, daytime fatigue, hypertension, gender, age. ?- Referred for sleep study testing ?

## 2021-05-15 NOTE — Telephone Encounter (Signed)
Dose increased to 50mg.

## 2021-05-17 NOTE — Progress Notes (Signed)
Internal Medicine Clinic Attending  Case discussed with Dr. Christian  At the time of the visit.  We reviewed the resident's history and exam and pertinent patient test results.  I agree with the assessment, diagnosis, and plan of care documented in the resident's note.  

## 2021-05-26 ENCOUNTER — Encounter: Payer: 59 | Admitting: Internal Medicine

## 2021-06-06 ENCOUNTER — Other Ambulatory Visit: Payer: Self-pay | Admitting: Internal Medicine

## 2021-06-06 NOTE — Telephone Encounter (Signed)
Thank you :)

## 2021-06-06 NOTE — Telephone Encounter (Signed)
Called pt to schedule an appt for BP check per Dr Ephriam Knuckles. Stated he will need early am appt ; he gets off work @ 1630PM. No available appts on yellow team - Appt scheduled with Dr Montez Morita on Thursday 6/1 @ 0845 AM.

## 2021-06-06 NOTE — Telephone Encounter (Signed)
Please ask pt to return for a blood pressure recheck.

## 2021-06-08 ENCOUNTER — Encounter: Payer: 59 | Admitting: Student

## 2021-06-08 NOTE — Progress Notes (Deleted)
   CC: Blood pressure check  HPI:  Mr.Kiowa Tineo is a 56 y.o. past medical history per below who presents for blood pressure check.  Past Medical History:  Diagnosis Date   Allergy    High cholesterol    Horseshoe kidney    Hypertension    Myocardial infarction [facility]) 2014   "they thought it was from taking Chantix" (07/16/2017)   Sinus headache    "all year round" (07/16/2017)   Small vessel disease (HCC)    Stroke (HCC) 2013   denies residual on 07/16/2017   Review of Systems:  ***  Physical Exam:  There were no vitals filed for this visit. ***  Assessment & Plan:   See Encounters Tab for problem based charting.  Patient {GC/GE:3044014::"discussed with","seen with"} Dr. {NAMES:[id number]::"Guilloud","Hoffman","Mullen","Narendra","Williams","Vincent"}

## 2021-06-09 ENCOUNTER — Ambulatory Visit (HOSPITAL_COMMUNITY): Payer: 59

## 2021-06-11 ENCOUNTER — Ambulatory Visit (HOSPITAL_COMMUNITY)
Admission: RE | Admit: 2021-06-11 | Discharge: 2021-06-11 | Disposition: A | Discharge status: Home / Self Care | Payer: 59 | Source: Ambulatory Visit | Attending: Student in an Organized Health Care Education/Training Program | Admitting: Student in an Organized Health Care Education/Training Program

## 2021-06-11 DIAGNOSIS — M5412 Radiculopathy, cervical region: Secondary | ICD-10-CM | POA: Diagnosis not present

## 2021-06-13 ENCOUNTER — Telehealth: Payer: Self-pay | Admitting: Internal Medicine

## 2021-06-13 DIAGNOSIS — M5412 Radiculopathy, cervical region: Secondary | ICD-10-CM

## 2021-06-13 MED ORDER — GABAPENTIN 300 MG PO CAPS: 300.0000 mg | ORAL_CAPSULE | Freq: Every day | ORAL | 0 refills | Status: DC

## 2021-06-13 NOTE — Telephone Encounter (Signed)
Results of C-spine MRI and treatment options reviewed with pt. He continues to experience radiculopathy in his arm but denies weakness. He would like to start with a trial of gabapentin. Adverse effects and precautions reviewed. Will start him on 300mg  at night and have him return in a couple of weeks for re-evaluation.

## 2021-06-13 NOTE — Assessment & Plan Note (Signed)
Results and treatment options reviewed with pt. He continues to experience radiculopathy in his arm but denies weakness. He would like to start with a trial of gabapentin. Adverse effects and precautions reviewed. Will start him on 300mg  at night and have him return in a couple of weeks for re-evaluation.

## 2021-06-13 NOTE — Progress Notes (Signed)
Results and treatment options reviewed with pt. See separate documentation.

## 2021-07-03 ENCOUNTER — Encounter: Payer: Self-pay | Admitting: *Deleted

## 2021-07-13 ENCOUNTER — Encounter: Payer: Self-pay | Admitting: Neurology

## 2021-07-13 ENCOUNTER — Ambulatory Visit (INDEPENDENT_AMBULATORY_CARE_PROVIDER_SITE_OTHER): Payer: Commercial Managed Care - HMO | Admitting: Neurology

## 2021-07-13 VITALS — BP 118/70 | HR 89 | Ht 71.0 in | Wt 183.8 lb

## 2021-07-13 DIAGNOSIS — R0681 Apnea, not elsewhere classified: Secondary | ICD-10-CM

## 2021-07-13 DIAGNOSIS — G4730 Sleep apnea, unspecified: Secondary | ICD-10-CM

## 2021-07-13 DIAGNOSIS — R0683 Snoring: Secondary | ICD-10-CM | POA: Diagnosis not present

## 2021-07-13 DIAGNOSIS — E663 Overweight: Secondary | ICD-10-CM

## 2021-07-13 DIAGNOSIS — G4719 Other hypersomnia: Secondary | ICD-10-CM | POA: Diagnosis not present

## 2021-07-13 DIAGNOSIS — Z820 Family history of epilepsy and other diseases of the nervous system: Secondary | ICD-10-CM

## 2021-07-13 DIAGNOSIS — R519 Headache, unspecified: Secondary | ICD-10-CM

## 2021-07-13 DIAGNOSIS — G4700 Insomnia, unspecified: Secondary | ICD-10-CM
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
## 2021-07-13 NOTE — Patient Instructions (Signed)

## 2021-07-16 ENCOUNTER — Other Ambulatory Visit: Payer: Self-pay | Admitting: Internal Medicine

## 2021-07-16 DIAGNOSIS — J309 Allergic rhinitis, unspecified: Secondary | ICD-10-CM

## 2021-07-17 ENCOUNTER — Telehealth: Payer: Self-pay | Admitting: Neurology

## 2021-07-17 ENCOUNTER — Other Ambulatory Visit: Payer: Self-pay | Admitting: Internal Medicine

## 2021-07-17 DIAGNOSIS — I10 Essential (primary) hypertension: Secondary | ICD-10-CM

## 2021-07-17 NOTE — Telephone Encounter (Signed)
Sent mychart message

## 2021-07-19 NOTE — Telephone Encounter (Signed)
Unclear notes about continuing this medication for his chronic sinusitis. Will decline refill until follows up

## 2021-07-20 ENCOUNTER — Other Ambulatory Visit: Payer: Self-pay | Admitting: Internal Medicine

## 2021-07-21 MED ORDER — NITROGLYCERIN 0.4 MG SL SUBL: 0.4000 mg | SUBLINGUAL_TABLET | SUBLINGUAL | 0 refills | Status: DC | PRN

## 2021-08-01 NOTE — Telephone Encounter (Signed)
[employer] pending faxed notes  

## 2021-08-08 NOTE — Telephone Encounter (Signed)
[employer] did not approve the NPSG-   HST- [employer] no auth req spoke to Kia C ref # [id number]   Patient is scheduled at [facility] for 08/22/21 at 3:00 pm.  Mailed packet to the patient.

## 2021-08-22 ENCOUNTER — Ambulatory Visit (INDEPENDENT_AMBULATORY_CARE_PROVIDER_SITE_OTHER): Payer: Commercial Managed Care - HMO | Admitting: Internal Medicine

## 2021-08-22 ENCOUNTER — Ambulatory Visit: Payer: Commercial Managed Care - HMO | Admitting: Neurology

## 2021-08-22 ENCOUNTER — Ambulatory Visit (HOSPITAL_COMMUNITY)
Admission: RE | Admit: 2021-08-22 | Discharge: 2021-08-22 | Disposition: A | Discharge status: Home / Self Care | Payer: Commercial Managed Care - HMO | Source: Ambulatory Visit | Attending: Internal Medicine | Admitting: Internal Medicine

## 2021-08-22 VITALS — BP 133/72 | HR 75 | Temp 98.6°F | Ht 71.0 in | Wt 186.3 lb

## 2021-08-22 DIAGNOSIS — G4719 Other hypersomnia: Secondary | ICD-10-CM

## 2021-08-22 DIAGNOSIS — R519 Headache, unspecified: Secondary | ICD-10-CM

## 2021-08-22 DIAGNOSIS — I10 Essential (primary) hypertension: Secondary | ICD-10-CM | POA: Diagnosis not present

## 2021-08-22 DIAGNOSIS — R0681 Apnea, not elsewhere classified: Secondary | ICD-10-CM

## 2021-08-22 DIAGNOSIS — M5412 Radiculopathy, cervical region: Secondary | ICD-10-CM

## 2021-08-22 DIAGNOSIS — G4733 Obstructive sleep apnea (adult) (pediatric): Secondary | ICD-10-CM

## 2021-08-22 DIAGNOSIS — E785 Hyperlipidemia, unspecified: Secondary | ICD-10-CM

## 2021-08-22 DIAGNOSIS — R0683 Snoring: Secondary | ICD-10-CM

## 2021-08-22 DIAGNOSIS — F17210 Nicotine dependence, cigarettes, uncomplicated: Secondary | ICD-10-CM

## 2021-08-22 DIAGNOSIS — Z820 Family history of epilepsy and other diseases of the nervous system: Secondary | ICD-10-CM

## 2021-08-22 DIAGNOSIS — E663 Overweight: Secondary | ICD-10-CM

## 2021-08-22 DIAGNOSIS — G4730 Sleep apnea, unspecified: Secondary | ICD-10-CM

## 2021-08-22 DIAGNOSIS — I491 Atrial premature depolarization: Secondary | ICD-10-CM | POA: Insufficient documentation

## 2021-08-22 DIAGNOSIS — G4700 Insomnia, unspecified: Secondary | ICD-10-CM

## 2021-08-22 MED ORDER — LOSARTAN POTASSIUM 50 MG PO TABS: 50.0000 mg | ORAL_TABLET | Freq: Every day | ORAL | 0 refills | Status: DC

## 2021-08-22 MED ORDER — HYDROCHLOROTHIAZIDE 25 MG PO TABS: 25.0000 mg | ORAL_TABLET | Freq: Every day | ORAL | 0 refills | Status: DC

## 2021-08-22 MED ORDER — GABAPENTIN 300 MG PO CAPS: 300.0000 mg | ORAL_CAPSULE | Freq: Every day | ORAL | 0 refills | Status: DC

## 2021-08-22 MED ORDER — ROSUVASTATIN CALCIUM 20 MG PO TABS: 20.0000 mg | ORAL_TABLET | Freq: Every day | ORAL | 1 refills | Status: DC

## 2021-08-22 NOTE — Assessment & Plan Note (Signed)
Patient noted on MRI to have multilevel disc disease. He was started on gabapentin 300 mg daily at bedtime for his symptoms which has helped. Plan: Refill for gabapentin sent in.

## 2021-08-22 NOTE — Assessment & Plan Note (Signed)
BP initially 134/97, improved to show control at 133/72. Current regimen includes HCTZ 25 mg daily, losartan 50 mg daily. Patient was noted to have mildly elevated calcium to 10.6 after initiating HCTZ. Plan:Continue current regimen. BMP to recheck electrolytes.  If persistently hypercalcemic we will discuss changing antihypertensive regimen with the patient.

## 2021-08-22 NOTE — Progress Notes (Signed)
Internal Medicine Clinic Attending  I saw and evaluated the patient.  I personally confirmed the key portions of the history and exam documented by Dr.  Dean  and I reviewed pertinent patient test results.  The assessment, diagnosis, and plan were formulated together and I agree with the documentation in the resident's note.  

## 2021-08-22 NOTE — Assessment & Plan Note (Signed)
LDL last checked earlier this year revealed LDL above goal of less than 70.  His current regimen is Crestor 20 mg daily and reports compliance with this medication. Plan: Crestor refill sent.  Check lipid panel today.

## 2021-08-22 NOTE — Progress Notes (Signed)
   CC: Routine follow-up  HPI:  [name] is a 56 y.o. person with past medical history as detailed below who presents today for routine follow-up.  Please see problem charting for detail assessment and plan.  Past Medical History:  Diagnosis Date   Allergy    High cholesterol    Horseshoe kidney    Hypertension    Myocardial infarction [facility]) 2014   "they thought it was from taking Chantix" (07/16/2017)   Sinus headache    "all year round" (07/16/2017)   Small vessel disease ([place])    Stroke ([place]) 2013   denies residual on 07/16/2017   Review of Systems: Negative unless otherwise stated.  Physical Exam:  Vitals:   08/22/21 0855 08/22/21 0935  BP: (!) 134/97 133/72  Pulse: 86 75  Temp: 98.6 F (37 C)   TempSrc: Oral   SpO2: 99%   Weight: 186 lb 4.8 oz (84.5 kg)   Height: $Remove'5\' 11"'mupfZBt$  (1.803 m)    Constitutional: Well-appearing gentleman in no acute distress. Cardio: Regular rhythm extra beat appreciated.  No radial pulse palpated in conjunction with the extra beat.  Pulm: Clear to auscultation bilaterally.  Normal for breathing on room air. MSK: Negative for extremity met. Skin: Warm and dry. Neuro: Alert and oriented x3.  No focal today. Psych: Pleasant mood and affect.  Assessment & Plan:   See Encounters Tab for problem based charting.  Essential hypertension BP initially 134/97, improved to show control at 133/72. Current regimen includes HCTZ 25 mg daily, losartan 50 mg daily. Patient was noted to have mildly elevated calcium to 10.6 after initiating HCTZ. Plan:Continue current regimen. BMP to recheck electrolytes.  If persistently hypercalcemic we will discuss changing antihypertensive regimen with the patient.  Cervical radiculopathy Patient noted on MRI to have multilevel disc disease. He was started on gabapentin 300 mg daily at bedtime for his symptoms which has helped. Plan: Refill for gabapentin sent in.  Hyperlipidemia LDL last checked earlier this year  revealed LDL above goal of less than 70.  His current regimen is Crestor 20 mg daily and reports compliance with this medication. Plan: Crestor refill sent.  Check lipid panel today.  Suspected OSA Patient reports that he is going to pick up the equipment to do a at home sleep study today.  PAC (premature atrial contraction) Patient noted to have an arrhythmia on physical exam, prompting EKG.  EKG showed sinus rhythm with a rate of 78 bpm and PACs.  This was noted on prior EKG in February 2023 well.  He is currently asymptomatic and denies chest pain or palpitations. Plan: I will reach out to his cardiologist to make them aware of his apparent increase in frequency of PACs.  Patient advised to present to emergency department if he develops chest pain, shortness of breathing, severe palpitations, syncope.   Patient discussed with Dr.  Cain Sieve

## 2021-08-22 NOTE — Assessment & Plan Note (Signed)
Patient reports that he is going to pick up the equipment to do a at home sleep study today.

## 2021-08-22 NOTE — Assessment & Plan Note (Signed)
Patient noted to have an arrhythmia on physical exam, prompting EKG.  EKG showed sinus rhythm with a rate of 78 bpm and PACs.  This was noted on prior EKG in February 2023 well.  He is currently asymptomatic and denies chest pain or palpitations. Plan: I will reach out to his cardiologist to make them aware of his apparent increase in frequency of PACs.  Patient advised to present to emergency department if he develops chest pain, shortness of breathing, severe palpitations, syncope.

## 2021-08-22 NOTE — Patient Instructions (Signed)
Jesse Sanders,  It was nice seeing you today! Thank you for choosing [facility] for your Primary Care.    Today we talked about:   Blood pressure: Your blood pressure looked great today! Continue taking losartan and hydrochlorothiazide. I am checking labs to make sure calcium level is safe. If it is I will not change your medicines, but if it is high, I will call you to talk about changing that medication to a different blood pressure medicine. Pain related to your spine: I have refilled your gabapentin. Elevated cholesterol:I have refilled your crestor. I am checking your cholesterol labs today. Sleep apnea:Don't forget to get the equipment for your home sleep study today!  Please follow-up in 6 months for regular check-up.  My best, Dr. August Saucer

## 2021-08-23 LAB — BMP8+ANION GAP
Anion Gap: 10 mmol/L (ref 10.0–18.0)
BUN/Creatinine Ratio: 14 (ref 9–20)
BUN: 14 mg/dL (ref 6–24)
CO2: 28 mmol/L (ref 20–29)
Calcium: 9.8 mg/dL (ref 8.7–10.2)
Chloride: 102 mmol/L (ref 96–106)
Creatinine, Ser: 1.03 mg/dL (ref 0.76–1.27)
Glucose: 84 mg/dL (ref 70–99)
Potassium: 4.1 mmol/L (ref 3.5–5.2)
Sodium: 140 mmol/L (ref 134–144)
eGFR: 86 mL/min/{1.73_m2} (ref >59)

## 2021-08-23 LAB — LIPID PANEL
Chol/HDL Ratio: 3.5 ratio (ref 0.0–5.0)
Cholesterol, Total: 133 mg/dL (ref 100–199)
HDL: 38 mg/dL — ABNORMAL LOW (ref >39)
LDL Chol Calc (NIH): 77 mg/dL (ref 0–99)
Triglycerides: 94 mg/dL (ref 0–149)
VLDL Cholesterol Cal: 18 mg/dL (ref 5–40)

## 2021-08-23 NOTE — Progress Notes (Signed)
See procedure note.

## 2021-08-25 ENCOUNTER — Encounter: Payer: Self-pay | Admitting: Internal Medicine

## 2021-08-25 MED ORDER — ROSUVASTATIN CALCIUM 40 MG PO TABS
40.0000 mg | ORAL_TABLET | Freq: Every day | ORAL | 2 refills | Status: DC
Start: 2021-08-25 — End: 2022-01-30

## 2021-08-28 NOTE — Procedures (Signed)
   [facility]  HOME SLEEP TEST (Watch PAT) REPORT  STUDY DATE: 08/22/2021  DOB: [birth year]  MRN: [id number]  ORDERING CLINICIAN: Huston Foley, MD, PhD   REFERRING CLINICIAN: PCP  CLINICAL INFORMATION/HISTORY: 56 year old right-handed gentleman with an underlying medical history of hypertension, hyperlipidemia, history of stroke, history of heart attack, allergies and mildly overweight state, who reports snoring and witnessed apneas, as well as chronic difficulty initiating sleep for years.    Epworth sleepiness score: 4/24.  BMI: 25.6 kg/m  FINDINGS:   Sleep Summary:   Total Recording Time (hours, min): 9 hours, 44 min  Total Sleep Time (hours, min):  7 hours, 2 min  Percent REM (%):    19.8%   Respiratory Indices:   Calculated pAHI (per hour):  27.2/hour         REM pAHI:    51.7/hour       NREM pAHI: 21.8/hour  Central pAHI: 0.9/hour  Oxygen Saturation Statistics:    Oxygen Saturation (%) Mean: 94%   Minimum oxygen saturation (%):                 88%   O2 Saturation Range (%): 88- 98%    O2 Saturation (minutes) <=88%: 0 min  Pulse Rate Statistics:   Pulse Mean (bpm):    59/min    Pulse Range (36-98/min)   IMPRESSION: OSA (obstructive sleep apnea)   RECOMMENDATION:  This home sleep test demonstrates moderate obstructive sleep apnea - by number of events - with a total AHI of 27.2/hour and O2 nadir of 88%.  Mild to moderate snoring was detected, at times in the louder range. Treatment with a positive airway pressure (PAP) device is recommended. The patient will be advised to proceed with an autoPAP titration/trial at home for now. A full night titration study may be considered to optimize treatment settings, if needed down the road.  Alternative treatment options may include a dental device through dentistry or orthodontics in selected patients or Inspire (hypoglossal nerve stimulator) in carefully selected patients (meeting inclusion  criteria).  Concomitant weight loss is recommended (where clinically appropriate). Please note that untreated obstructive sleep apnea may carry additional perioperative morbidity. Patients with significant obstructive sleep apnea should receive perioperative PAP therapy and the surgeons and particularly the anesthesiologist should be informed of the diagnosis and the severity of the sleep disordered breathing. The patient should be cautioned not to drive, work at heights, or operate dangerous or heavy equipment when tired or sleepy. Review and reiteration of good sleep hygiene measures should be pursued with any patient. Other causes of the patient's symptoms, including circadian rhythm disturbances, an underlying mood disorder, medication effect and/or an underlying medical problem cannot be ruled out based on this test. Clinical correlation is recommended. The patient and his referring provider will be notified of the test results. The patient will be seen in follow up in sleep clinic at [facility].  I certify that I have reviewed the raw data recording prior to the issuance of this report in accordance with the standards of the American Academy of Sleep Medicine (AASM).    INTERPRETING PHYSICIAN:   Huston Foley, MD, PhD  Board Certified in Neurology and Sleep Medicine  [facility] [address] [place], [place] [address] [phone]

## 2021-08-28 NOTE — Addendum Note (Signed)
Addended by: Huston Foley on: 08/28/2021 06:06 PM   Modules accepted: Orders

## 2021-09-06 ENCOUNTER — Other Ambulatory Visit: Payer: Self-pay | Admitting: Internal Medicine

## 2021-09-06 ENCOUNTER — Telehealth: Payer: Self-pay | Admitting: *Deleted

## 2021-09-06 ENCOUNTER — Other Ambulatory Visit: Payer: Self-pay | Admitting: Student

## 2021-09-06 DIAGNOSIS — J309 Allergic rhinitis, unspecified: Secondary | ICD-10-CM

## 2021-09-06 NOTE — Telephone Encounter (Signed)
Spoke with patient and discussed his sleep study results.  Patient aware his home sleep test showed moderate OSA.  He is amenable to using an AutoPap machine.  We discussed the next steps which involves sending the order over to a local DME company.  Patient also aware of insurance compliance requirements which includes using the machine at least 4 hours at night and also being seen in our office between 30 and 90 days after set up.  The patient scheduled an initial follow-up appointment for Wednesday, November 8 at 7:45 AM.  He is aware to arrive 15 to 30 minutes ahead of time and bring his machine and power cord.  The patient will watch for a call from [facility].  He will get a follow-up letter via MyChart.  His questions were answered.  AutoPap order, office note, sleep study result, insurance information faxed to [facility]. Received a receipt of confirmation.

## 2021-09-06 NOTE — Telephone Encounter (Signed)
Reviewed chart and found that Montelukast was discontinued in 2019 due to not having the desired effect. Review also shows this medication has not been used in over 30 days and has previously been denied. I will deny the request for these reasons and will plan to discuss this medication with the patient in the clinic.

## 2021-09-06 NOTE — Telephone Encounter (Signed)
-----   Message from Huston Foley, MD sent at 08/28/2021  6:06 PM EDT ----- Patient referred by PCP, seen by me on 07/13/2021, patient had a HST on 08/22/2021.    Please call and notify the patient that the recent home sleep test showed obstructive sleep apnea in the moderate range. I recommend treatment in the form of autoPAP, which means, that we don't have to bring him in for a sleep study with CPAP, but will let him start using a so called autoPAP machine at home, which is a CPAP-like machine with self-adjusting pressures. We will send the order to a local DME company (of his choice, or as per insurance requirement). The DME representative will fit him with a mask, educate him on how to use the machine, how to put the mask on, etc. I have placed an order in the chart. Please send the order, talk to patient, send report to referring MD. We will need a FU in sleep clinic for 10 weeks post-PAP set up, please arrange that with me or one of our NPs. Also reinforce the need for compliance with treatment. Thanks,   Huston Foley, MD, PhD [facility])

## 2021-11-14 ENCOUNTER — Telehealth: Payer: Self-pay | Admitting: *Deleted

## 2021-11-14 NOTE — Telephone Encounter (Signed)
Noted pt read message, (mychart).   Will need to cancel appt as had not received machine.  I cancelled the appt.

## 2021-11-14 NOTE — Telephone Encounter (Signed)
I called pt and could not LM, VM full.  I called [facility] and they sent letter out to pt as they have not been able to reach him as well since 09/2021.  He has not gotten machine.  No need for appt.

## 2021-11-15 ENCOUNTER — Ambulatory Visit: Payer: Commercial Managed Care - HMO | Admitting: Neurology

## 2021-12-02 ENCOUNTER — Other Ambulatory Visit: Payer: Self-pay | Admitting: Internal Medicine

## 2021-12-02 ENCOUNTER — Other Ambulatory Visit: Payer: Self-pay | Admitting: Student

## 2021-12-02 DIAGNOSIS — J309 Allergic rhinitis, unspecified: Secondary | ICD-10-CM

## 2021-12-02 DIAGNOSIS — R0602 Shortness of breath: Secondary | ICD-10-CM

## 2022-01-05 ENCOUNTER — Other Ambulatory Visit: Payer: Self-pay | Admitting: Internal Medicine

## 2022-01-05 DIAGNOSIS — M5412 Radiculopathy, cervical region: Secondary | ICD-10-CM

## 2022-01-29 ENCOUNTER — Other Ambulatory Visit: Payer: Self-pay | Admitting: Internal Medicine

## 2022-01-29 DIAGNOSIS — I10 Essential (primary) hypertension: Secondary | ICD-10-CM

## 2022-01-30 ENCOUNTER — Other Ambulatory Visit: Payer: Self-pay | Admitting: Student

## 2022-02-15 ENCOUNTER — Encounter: Payer: Self-pay | Admitting: Student

## 2022-02-15 ENCOUNTER — Ambulatory Visit (INDEPENDENT_AMBULATORY_CARE_PROVIDER_SITE_OTHER): Payer: 59 | Admitting: Student

## 2022-02-15 VITALS — BP 125/74 | HR 77 | Temp 98.2°F | Ht 71.0 in | Wt 183.0 lb

## 2022-02-15 DIAGNOSIS — E782 Mixed hyperlipidemia: Secondary | ICD-10-CM | POA: Diagnosis not present

## 2022-02-15 DIAGNOSIS — M5412 Radiculopathy, cervical region: Secondary | ICD-10-CM

## 2022-02-15 DIAGNOSIS — F17210 Nicotine dependence, cigarettes, uncomplicated: Secondary | ICD-10-CM | POA: Diagnosis not present

## 2022-02-15 DIAGNOSIS — G4733 Obstructive sleep apnea (adult) (pediatric): Secondary | ICD-10-CM

## 2022-02-15 DIAGNOSIS — F17200 Nicotine dependence, unspecified, uncomplicated: Secondary | ICD-10-CM

## 2022-02-15 DIAGNOSIS — I491 Atrial premature depolarization: Secondary | ICD-10-CM

## 2022-02-15 DIAGNOSIS — I10 Essential (primary) hypertension: Secondary | ICD-10-CM

## 2022-02-15 DIAGNOSIS — Z1159 Encounter for screening for other viral diseases: Secondary | ICD-10-CM

## 2022-02-15 DIAGNOSIS — J309 Allergic rhinitis, unspecified: Secondary | ICD-10-CM

## 2022-02-15 DIAGNOSIS — Z8673 Personal history of transient ischemic attack (TIA), and cerebral infarction without residual deficits: Secondary | ICD-10-CM

## 2022-02-15 DIAGNOSIS — Z122 Encounter for screening for malignant neoplasm of respiratory organs: Secondary | ICD-10-CM

## 2022-02-15 MED ORDER — CETIRIZINE HCL 10 MG PO TABS: 10.0000 mg | ORAL_TABLET | Freq: Every day | ORAL | 3 refills | Status: DC

## 2022-02-15 NOTE — Assessment & Plan Note (Signed)
States he has been cutting down on his tobacco use. He is down to half a pack of cigarettes per day. Offered resources however patient states he will continue to work on quitting on his own.  Last lung cancer screening in 02/2021 showed was negative with Lung-RADS 1. -Continue to encourage smoking cessation at each visit -Repeat LDCT lung cancer

## 2022-02-15 NOTE — Assessment & Plan Note (Signed)
Continues to have some extra beats on cardiac auscultation. Denies any chest pain, dizziness or palpitations. -Continue to monitor

## 2022-02-15 NOTE — Assessment & Plan Note (Addendum)
Patient blood pressure is well-controlled on the current regimen.  States he has refills on his blood pressure medications. Vitals:   02/15/22 0943  BP: 125/74   Plan: -Continue HCTZ 25 mg daily -Continue losartan 50 mg daily -Check BMP -Follow-up in 6 months

## 2022-02-15 NOTE — Assessment & Plan Note (Signed)
Patient completed a home sleep study in August 2023 that showed moderate OSA with a total AHI of 27.2/h with mild to moderate snoring. An AutoPAP was recommended and patient was scheduled for AutoPap titration study.  Patient states he currently does not have the money to get his AutoPAP machine but will get it once he has the money. Reports sleeping for only few hours each night but does not endorse significant fatigue.

## 2022-02-15 NOTE — Progress Notes (Signed)
CC: Follow-up  HPI:  [name] is a 57 y.o. male with PMH as below who presents to clinic to follow-up on his chronic medical problems. Please see problem based charting for evaluation, assessment and plan.  Past Medical History:  Diagnosis Date   Allergy    High cholesterol    Horseshoe kidney    Hypertension    Myocardial infarction [facility]) 2014   "they thought it was from taking Chantix" (07/16/2017)   Sinus headache    "all year round" (07/16/2017)   Small vessel disease ([place])    Stroke ([place]) 2013   denies residual on 07/16/2017    Review of Systems:  Constitutional: Positive for insomnia. Negative for fever or fatigue Eyes: Negative for visual changes Respiratory: Negative for shortness of breath MSK: Positive for occasional neck pain. Abdomen: Negative for abdominal pain, constipation or diarrhea Neuro: Negative for headache, dizziness or weakness  Physical Exam: General: Pleasant, well-appearing middle-age man. No acute distress. Cardiac: Regular rate. Regular rhythm with occasional extra beats. No murmurs, rubs or gallops. No LE edema Respiratory: Lungs CTAB. No wheezing or crackles. Abdominal: Soft, symmetric and non tender. Normal BS. Skin: Warm, dry and intact without rashes or lesions Extremities: Atraumatic.  Palpable radial pulses. Neuro: A&O x 3. Moves all extremities.  Sensation to gross touch. Psych: Appropriate mood.  Flat affect.  Vitals:   02/15/22 0943  BP: 125/74  Pulse: 77  Temp: 98.2 F (36.8 C)  TempSrc: Oral  SpO2: 99%  Weight: 183 lb (83 kg)  Height: 5\' 11"  (1.803 m)    Assessment & Plan:   Essential hypertension Patient blood pressure is well-controlled on the current regimen.  States he has refills on his blood pressure medications. Vitals:   02/15/22 0943  BP: 125/74   Plan: -Continue HCTZ 25 mg daily -Continue losartan 50 mg daily -Check BMP -Follow-up in 6 months  Cervical radiculopathy Patient states the gabapentin  has been helping with his cervical radiculopathy symptoms. I will change his gabapentin to 90-day supplies. -Continue gabapentin 300 mg at bedtime  OSA (obstructive sleep apnea) Patient completed a home sleep study in August 2023 that showed moderate OSA with a total AHI of 27.2/h with mild to moderate snoring. An AutoPAP was recommended and patient was scheduled for AutoPap titration study.  Patient states he currently does not have the money to get his AutoPAP machine but will get it once he has the money. Reports sleeping for only few hours each night but does not endorse significant fatigue.  Hyperlipidemia Last lipid panel about 6 months ago showed LDL 77. -Continue rosuvastatin 40 mg daily  History of TIA (transient ischemic attack) Continues to take baby dose aspirin over-the-counter. -Continue aspirin 81 mg daily -Continue rosuvastatin 40 mg daily  Tobacco use disorder States he has been cutting down on his tobacco use. He is down to half a pack of cigarettes per day. Offered resources however patient states he will continue to work on quitting on his own.  Last lung cancer screening in 02/2021 showed was negative with Lung-RADS 1. -Continue to encourage smoking cessation at each visit -Repeat LDCT lung cancer  Allergic rhinitis Patient reports he does not have any refills on his cetirizine.  States his nasal congestion gets worse during seasonal changes. -Refill cetirizine 10 mg daily  PAC (premature atrial contraction) Continues to have some extra beats on cardiac auscultation. Denies any chest pain, dizziness or palpitations. -Continue to monitor   See Encounters Tab for problem based  charting.  Patient discussed with Dr. Michelle Nasuti, MD, MPH

## 2022-02-15 NOTE — Assessment & Plan Note (Signed)
Patient states the gabapentin has been helping with his cervical radiculopathy symptoms. I will change his gabapentin to 90-day supplies. -Continue gabapentin 300 mg at bedtime

## 2022-02-15 NOTE — Assessment & Plan Note (Signed)
Continues to take baby dose aspirin over-the-counter. -Continue aspirin 81 mg daily -Continue rosuvastatin 40 mg daily

## 2022-02-15 NOTE — Assessment & Plan Note (Signed)
Patient reports he does not have any refills on his cetirizine.  States his nasal congestion gets worse during seasonal changes. -Refill cetirizine 10 mg daily

## 2022-02-15 NOTE — Assessment & Plan Note (Signed)
Last lipid panel about 6 months ago showed LDL 77. -Continue rosuvastatin 40 mg daily

## 2022-02-15 NOTE — Patient Instructions (Signed)
Thank you, Mr.Tasha Geidel for allowing [name] to provide your care today. Today we discussed your hypertension, hyperlipidemia, OSA, cervical radiculopathy.  I have sent refills of your cetirizine to your pharmacy.  Please make sure to get your CPAP as soon as you are able to to help with your sleep apnea.  I have ordered the following labs for you:   Lab Orders         BMP8+Anion Gap         Hepatitis C Ab reflex to Quant PCR      I will call if any are abnormal. All of your labs can be accessed through "My Chart".  My Chart Access: [email or web address]  Please follow-up in 6 months  Please make sure to arrive 15 minutes prior to your next appointment. If you arrive late, you may be asked to reschedule.    We look forward to seeing you next time. Please call our clinic at [phone] if you have any questions or concerns. The best time to call is Monday-Friday from 9am-4pm, but there is someone available 24/7. If after hours or the weekend, call the main hospital number and ask for the Internal Medicine Resident On-Call. If you need medication refills, please notify your pharmacy one week in advance and they will send [name] a request.   Thank you for letting us take part in your care. Wishing you the best!  Lacinda Axon, MD 02/15/2022, 10:06 AM IM Resident, PGY-3 [facility] 41:10

## 2022-02-16 LAB — BMP8+ANION GAP
Anion Gap: 15 mmol/L (ref 10.0–18.0)
BUN/Creatinine Ratio: 15 (ref 9–20)
BUN: 17 mg/dL (ref 6–24)
CO2: 23 mmol/L (ref 20–29)
Calcium: 10.1 mg/dL (ref 8.7–10.2)
Chloride: 98 mmol/L (ref 96–106)
Creatinine, Ser: 1.15 mg/dL (ref 0.76–1.27)
Glucose: 88 mg/dL (ref 70–99)
Potassium: 4.1 mmol/L (ref 3.5–5.2)
Sodium: 136 mmol/L (ref 134–144)
eGFR: 75 mL/min/{1.73_m2} (ref >59)

## 2022-02-16 LAB — HCV INTERPRETATION

## 2022-02-16 LAB — HCV AB W REFLEX TO QUANT PCR: HCV Ab: NONREACTIVE

## 2022-02-16 NOTE — Progress Notes (Signed)
BMP shows normal kidney function with no electrolyte abnormalities. Hepatitis C screening negative. Patient called and informed of lab results.  No medication changes necessary.

## 2022-02-20 NOTE — Progress Notes (Signed)
Internal Medicine Clinic Attending ° °Case discussed with Dr. Amponsah  at the time of the visit.  We reviewed the resident’s history and exam and pertinent patient test results.  I agree with the assessment, diagnosis, and plan of care documented in the resident’s note.  °

## 2022-02-27 ENCOUNTER — Encounter: Payer: Self-pay | Admitting: Gastroenterology

## 2022-02-28 ENCOUNTER — Telehealth: Payer: Self-pay

## 2022-02-28 NOTE — Telephone Encounter (Signed)
Pa for pt ( ROSUVASTATIN  CAL 40MG TAB ) came through on cover my meds was submitted with last OV .Marland Kitchen Awaiting approval or denial

## 2022-03-02 NOTE — Telephone Encounter (Signed)
DECISION :  APPROVED    As long as you remain covered by your prescription drug plan and there are no changes to your plan benefits,     this request is approved for the following time period: 03/01/2022 - 03/02/2023           ( [place] )

## 2022-03-21 ENCOUNTER — Other Ambulatory Visit: Payer: Self-pay | Admitting: Student

## 2022-04-13 ENCOUNTER — Other Ambulatory Visit: Payer: Self-pay | Admitting: Student

## 2022-04-13 DIAGNOSIS — M5412 Radiculopathy, cervical region: Secondary | ICD-10-CM

## 2022-04-19 ENCOUNTER — Encounter: Payer: Self-pay | Admitting: Student

## 2022-05-07 ENCOUNTER — Other Ambulatory Visit: Payer: Self-pay | Admitting: Student

## 2022-05-07 DIAGNOSIS — J309 Allergic rhinitis, unspecified: Secondary | ICD-10-CM

## 2022-05-07 DIAGNOSIS — R0602 Shortness of breath: Secondary | ICD-10-CM

## 2022-07-18 ENCOUNTER — Other Ambulatory Visit: Payer: Self-pay

## 2022-07-18 DIAGNOSIS — M5412 Radiculopathy, cervical region: Secondary | ICD-10-CM

## 2022-07-18 MED ORDER — GABAPENTIN 300 MG PO CAPS: ORAL_CAPSULE | ORAL | 2 refills | Status: DC

## 2022-07-18 MED ORDER — NITROGLYCERIN 0.4 MG SL SUBL: 0.4000 mg | SUBLINGUAL_TABLET | SUBLINGUAL | 0 refills | Status: DC | PRN

## 2022-08-14 ENCOUNTER — Other Ambulatory Visit: Payer: Self-pay | Admitting: Student

## 2022-09-06 ENCOUNTER — Other Ambulatory Visit: Payer: Self-pay | Admitting: Student

## 2022-09-24 ENCOUNTER — Encounter: Payer: Self-pay | Admitting: Neurology

## 2022-09-24 ENCOUNTER — Ambulatory Visit (INDEPENDENT_AMBULATORY_CARE_PROVIDER_SITE_OTHER): Payer: Commercial Managed Care - HMO | Admitting: Neurology

## 2022-09-24 VITALS — BP 132/84 | HR 69 | Ht 71.0 in | Wt 188.0 lb

## 2022-09-24 DIAGNOSIS — G478 Other sleep disorders: Secondary | ICD-10-CM

## 2022-09-24 DIAGNOSIS — G4733 Obstructive sleep apnea (adult) (pediatric): Secondary | ICD-10-CM

## 2022-09-24 DIAGNOSIS — G4700 Insomnia, unspecified: Secondary | ICD-10-CM

## 2022-09-24 NOTE — Patient Instructions (Addendum)
We will set you up at home with a so called autoPAP machine for treatment of your obstructive sleep apnea.  I will put another order in and we will send it to a DME (durable medical equipment) company; they will be in touch with you to get you your machine and fit you with a mask.   Please use your autoPAP regularly. While your insurance requires that you use PAP at least 4 hours each night on 70% of the nights, I recommend, that you not skip any nights and use it throughout the night if you can. Getting used to PAP and staying with the treatment long term does take time and patience and discipline. Untreated obstructive sleep apnea when it is moderate to severe can have an adverse impact on cardiovascular health and raise her risk for heart disease, arrhythmias, hypertension, congestive heart failure, stroke and diabetes. Untreated obstructive sleep apnea causes sleep disruption, nonrestorative sleep, and sleep deprivation. This can have an impact on your day to day functioning and cause daytime sleepiness and impairment of cognitive function, memory loss, mood disturbance, and problems focussing. Using PAP regularly can improve these symptoms.
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
## 2022-09-27 ENCOUNTER — Telehealth: Payer: Self-pay | Admitting: *Deleted

## 2022-09-27 NOTE — Telephone Encounter (Signed)
Order for new cpap machine and supplies sent to [facility].

## 2022-10-01 NOTE — Telephone Encounter (Signed)
[facility] confirmed receipt of order.  

## 2022-10-29 NOTE — Telephone Encounter (Signed)
Received fax from [facility] that pt has declined to move forward with set up at this time due to patients OOP cost.  Pt is aware that he can give them a call back if they decide to move forward.

## 2022-11-26 ENCOUNTER — Other Ambulatory Visit: Payer: Self-pay | Admitting: Student

## 2022-11-26 DIAGNOSIS — M5412 Radiculopathy, cervical region: Secondary | ICD-10-CM

## 2022-12-19 ENCOUNTER — Ambulatory Visit: Payer: Commercial Managed Care - HMO | Admitting: Neurology

## 2023-01-14 ENCOUNTER — Ambulatory Visit
Admission: EM | Admit: 2023-01-14 | Discharge: 2023-01-14 | Disposition: A | Discharge status: Home / Self Care | Payer: Commercial Managed Care - HMO | Attending: Family Medicine | Admitting: Family Medicine

## 2023-01-14 DIAGNOSIS — M5432 Sciatica, left side: Secondary | ICD-10-CM

## 2023-01-14 DIAGNOSIS — R200 Anesthesia of skin: Secondary | ICD-10-CM

## 2023-01-14 DIAGNOSIS — R202 Paresthesia of skin: Secondary | ICD-10-CM | POA: Diagnosis not present

## 2023-01-14 MED ORDER — CYCLOBENZAPRINE HCL 10 MG PO TABS: 10.0000 mg | ORAL_TABLET | Freq: Every day | ORAL | 0 refills | Status: AC | PRN

## 2023-01-14 MED ORDER — PREDNISONE 20 MG PO TABS: 40.0000 mg | ORAL_TABLET | Freq: Every day | ORAL | 0 refills | Status: AC

## 2023-01-14 NOTE — ED Triage Notes (Signed)
 Patient presents with left hip pain that travels down to ankle that started on Friday. No injury noted. Treated with Asprin, Ibuprofen and Tylenol without any relief.
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
## 2023-02-25 ENCOUNTER — Other Ambulatory Visit: Payer: Self-pay | Admitting: Student

## 2023-02-25 ENCOUNTER — Encounter: Payer: Self-pay | Admitting: *Deleted

## 2023-02-25 ENCOUNTER — Other Ambulatory Visit: Payer: Self-pay

## 2023-02-25 DIAGNOSIS — I10 Essential (primary) hypertension: Secondary | ICD-10-CM

## 2023-02-25 DIAGNOSIS — M5412 Radiculopathy, cervical region: Secondary | ICD-10-CM

## 2023-02-25 NOTE — Telephone Encounter (Signed)
LOV 02/15/22. I called pt to schedule an appt - no answer, no VM. My Chart message sent .

## 2023-02-26 MED ORDER — ROSUVASTATIN CALCIUM 40 MG PO TABS: 40.0000 mg | ORAL_TABLET | Freq: Every day | ORAL | 3 refills | Status: AC

## 2023-03-04 ENCOUNTER — Ambulatory Visit: Payer: Commercial Managed Care - HMO | Admitting: Student

## 2023-03-04 VITALS — BP 151/89 | HR 76 | Temp 98.0°F | Ht 71.0 in | Wt 185.2 lb

## 2023-03-04 DIAGNOSIS — M5412 Radiculopathy, cervical region: Secondary | ICD-10-CM | POA: Diagnosis not present

## 2023-03-04 DIAGNOSIS — I10 Essential (primary) hypertension: Secondary | ICD-10-CM | POA: Diagnosis not present

## 2023-03-04 MED ORDER — LOSARTAN POTASSIUM 100 MG PO TABS
100.0000 mg | ORAL_TABLET | Freq: Every day | ORAL | 3 refills | Status: DC
Start: 2023-03-04 — End: 2023-09-10

## 2023-03-04 NOTE — Patient Instructions (Signed)
  Thank you, [name], for allowing [name] to provide your care today. Today we discussed . . .  > Cervical stenosis       -For your cervical stenosis that is causing the numbness and pain in your right arm I would like to send you back to neurosurgery.  If you have the information to call the surgeon you have seen in the past I would recommend that you do that but we have also sent in a referral if you are unable to.  If the gabapentin has been working for your pain you can start taking it twice a day.  If you do this please let us know and we can redo the prescription to make sure that you have enough. > Blood pressure       -Since your blood pressure is still elevated I would like to increase your losartan from 50 mg daily to 100 mg daily.  We will have you back in about 3 to 4 weeks to recheck your blood pressure and check your kidney function and electrolytes.  If you do have a blood pressure cuff at home I would like you to check your blood pressures twice a day and write them down.  If you bring them to your lab visit we can scan them in and I can look at them. > Pain       -For your other pain you can take Tylenol 1000 mg every 8 hours as needed as well as ibuprofen 400 mg every 4 hours.  If you have had a reaction to this like you have in the past with Aleve then do not take ibuprofen but I would recommend that you do not take aspirin every day for pain.  You can also use topical Voltaren gel as well as lidocaine patches that you can get over-the-counter.   I have ordered the following labs for you:   Lab Orders         BMP8+Anion Gap      Referrals ordered today:    Referral Orders         Ambulatory referral to Neurosurgery       I have ordered the following medication/changed the following medications:   Stop the following medications: Medications Discontinued During This Encounter  Medication Reason   losartan (COZAAR) 50 MG tablet      Start the following  medications: Meds ordered this encounter  Medications   losartan (COZAAR) 100 MG tablet    Sig: Take 1 tablet (100 mg total) by mouth daily.    Dispense:  30 tablet    Refill:  3      Follow up: 3 to 4 weeks for nurse visit to check blood pressure and repeat metabolic panel.  Regular office visit in 3 months   Remember:     Should you have any questions or concerns please call the internal medicine clinic at [phone].     Rocky Morel, DO [facility]

## 2023-03-04 NOTE — Progress Notes (Unsigned)
   CC: Acute Concern of right upper extremity radicular symptoms  HPI:  Jesse Sanders is a 58 y.o. male with pertinent PMH of HTN, OSA, HLD, stable angina, cervical stenosis, and TUD who presents as above. Please see assessment and plan below for further details.  Review of Systems:   Pertinent items noted in HPI and/or A&P.  Physical Exam:  Vitals:   03/04/23 1453 03/04/23 1530  BP: (!) 154/96 (!) 151/89  Pulse: 79 76  Temp: 98 F (36.7 C)   TempSrc: Oral   SpO2: 99%   Weight: 185 lb 3.2 oz (84 kg)   Height: 5\' 11"  (1.803 m)     Constitutional: Well-appearing adult male. In no acute distress. HEENT: Normocephalic, atraumatic, Sclera non-icteric, PERRL, EOM intact, no midline spinal tenderness in the cervical spine Cardio:Regular rate and rhythm. 2+ bilateral radial pulses. Pulm:Normal work of breathing on room air. Abdomen: Soft, non-tender, non-distended, positive bowel sounds. MSK: Spurling's maneuver positive on the right Skin:Warm and dry. Neuro:Alert and oriented x3.  Bilateral upper extremity strength 5 out of 5 Psych:Pleasant mood and affect.   Assessment & Plan:   Essential hypertension Blood pressure today elevated 151/89.  Patient is currently on losartan 50 mg daily and hydrochlorothiazide 25 mg daily. - Increase losartan to 100 mg daily and continue hydrochlorothiazide 25 mg daily - BMP today  Cervical radiculopathy Patient comes in with a new right upper extremity radicular symptoms including pain and numbness without weakness.  MRI in 2023 showed moderate to severe spinal stenosis at C3/4 through C6/7 that have progressed since MRI in 2017.  On exam upper extremity motor strength 5 out of 5 bilaterally, Spurling's maneuver positive on the right, no midline spinal tenderness.  Overall consistent with progressive spinal stenosis and he is currently on gabapentin 300 mg nightly which has helped with his other radicular symptoms such as left lower extremity  sciatica. - Increase gabapentin to 300 mg BID and refer to neurosurgery    Patient discussed with Dr. Alfonzo Beers, DO [facility] Internal [facility] Resident PGY-2 Clinic Phone: [phone] Pager: [phone]

## 2023-03-05 NOTE — Assessment & Plan Note (Signed)
 Patient comes in with a new right upper extremity radicular symptoms including pain and numbness without weakness.  MRI in 2023 showed moderate to severe spinal stenosis at C3/4 through C6/7 that have progressed since MRI in 2017.  On exam upper extremity motor strength 5 out of 5 bilaterally, Spurling's maneuver positive on the right, no midline spinal tenderness.  Overall consistent with progressive spinal stenosis and he is currently on gabapentin 300 mg nightly which has helped with his other radicular symptoms such as left lower extremity sciatica. - Increase gabapentin to 300 mg BID and refer to neurosurgery

## 2023-03-05 NOTE — Assessment & Plan Note (Signed)
 Blood pressure today elevated 151/89.  Patient is currently on losartan 50 mg daily and hydrochlorothiazide 25 mg daily. - Increase losartan to 100 mg daily and continue hydrochlorothiazide 25 mg daily - BMP today

## 2023-03-06 LAB — BMP8+ANION GAP
Anion Gap: 16 mmol/L (ref 10.0–18.0)
BUN/Creatinine Ratio: 15 (ref 9–20)
BUN: 17 mg/dL (ref 6–24)
CO2: 19 mmol/L — ABNORMAL LOW (ref 20–29)
Calcium: 9.7 mg/dL (ref 8.7–10.2)
Chloride: 106 mmol/L (ref 96–106)
Creatinine, Ser: 1.12 mg/dL (ref 0.76–1.27)
Glucose: 71 mg/dL (ref 70–99)
Potassium: 4.3 mmol/L (ref 3.5–5.2)
Sodium: 141 mmol/L (ref 134–144)
eGFR: 77 mL/min/{1.73_m2} (ref >59)

## 2023-03-11 NOTE — Progress Notes (Signed)
 [facility] Attending  Case discussed with the resident at the time of the visit.  We reviewed the resident's history and exam and pertinent patient test results.  I agree with the assessment, diagnosis, and plan of care documented in the resident's note.

## 2023-04-12 ENCOUNTER — Other Ambulatory Visit: Payer: Self-pay | Admitting: Student

## 2023-04-12 DIAGNOSIS — I10 Essential (primary) hypertension: Secondary | ICD-10-CM

## 2023-04-15 ENCOUNTER — Other Ambulatory Visit: Payer: Self-pay

## 2023-04-15 MED ORDER — CETIRIZINE HCL 10 MG PO TABS: 10.0000 mg | ORAL_TABLET | Freq: Every day | ORAL | 3 refills | Status: AC

## 2023-04-15 NOTE — Telephone Encounter (Signed)
 Medication sent to pharmacy

## 2023-04-15 NOTE — Telephone Encounter (Signed)
Refill request has already been addressed.

## 2023-06-06 ENCOUNTER — Other Ambulatory Visit: Payer: Self-pay | Admitting: Student

## 2023-06-06 DIAGNOSIS — I10 Essential (primary) hypertension: Secondary | ICD-10-CM

## 2023-06-07 NOTE — Telephone Encounter (Signed)
 Medication sent to pharmacy

## 2023-06-13 DIAGNOSIS — G5603 Carpal tunnel syndrome, bilateral upper limbs: Secondary | ICD-10-CM | POA: Diagnosis not present

## 2023-07-02 DIAGNOSIS — M4722 Other spondylosis with radiculopathy, cervical region: Secondary | ICD-10-CM | POA: Diagnosis not present

## 2023-07-02 DIAGNOSIS — Z6825 Body mass index (BMI) 25.0-25.9, adult: Secondary | ICD-10-CM | POA: Diagnosis not present

## 2023-07-02 DIAGNOSIS — G5601 Carpal tunnel syndrome, right upper limb: Secondary | ICD-10-CM | POA: Diagnosis not present

## 2023-07-02 IMAGING — CT CT CHEST LUNG CANCER SCREENING LOW DOSE W/O CM
2 of 4 series · 14 of 36 positions shown, 17 images · non-contrast
Comparison: 07/17/2017 calcium score CT. No prior lung cancer
screening CT.
COMPARISON: 07/17/2017 calcium score CT. No prior lung cancer
screening CT.

Addendum:
CLINICAL DATA: Twenty-two pack-year smoking history. Current
smoker.

EXAM:
CT CHEST WITHOUT CONTRAST LOW-DOSE FOR LUNG CANCER SCREENING
TECHNIQUE: Multidetector CT imaging of the chest was performed following the
standard protocol without IV contrast.
RADIATION DOSE REDUCTION: This exam was performed according to the
departmental dose-optimization program which includes automated
exposure control, adjustment of the mA and/or kV according to
patient size and/or use of iterative reconstruction technique.
CLINICAL DATA: 55M with hypertension, hyperlipidemia, tobacco
abuse, and chest pain.
Cardiac/Coronary  CT
TECHNIQUE: The patient was scanned on a Phillips Force scanner.

[Series 4: lungs · axial · 0.63mm/px · z∈[-281,-1]mm · 11 of 310 slices shown, 14 images]
[im 15/310  mediastinal]
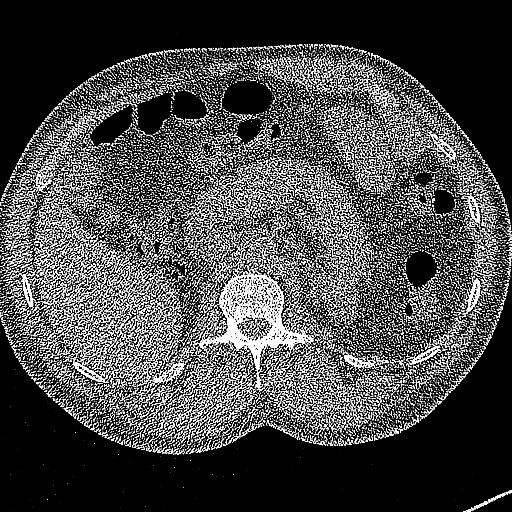
[im 15/310  lung]
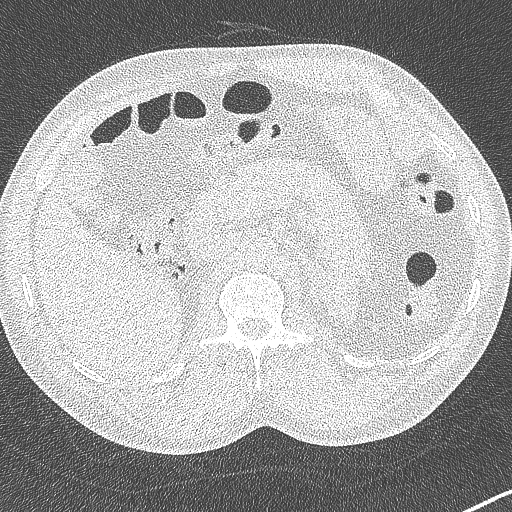
[im 43/310  lung]
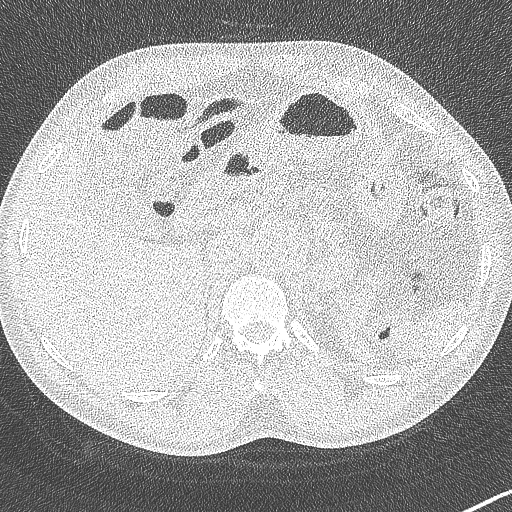
[im 71/310  lung]
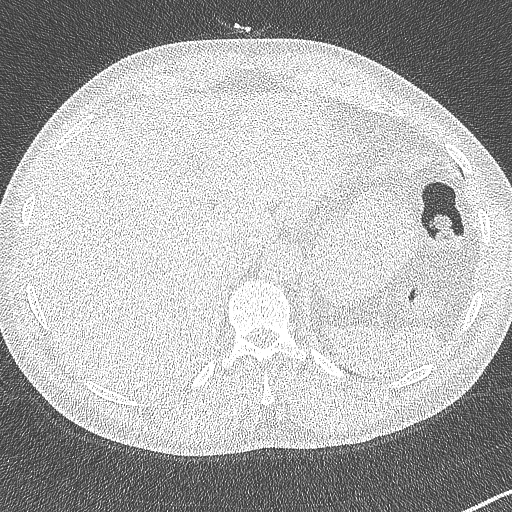
[im 99/310  lung]
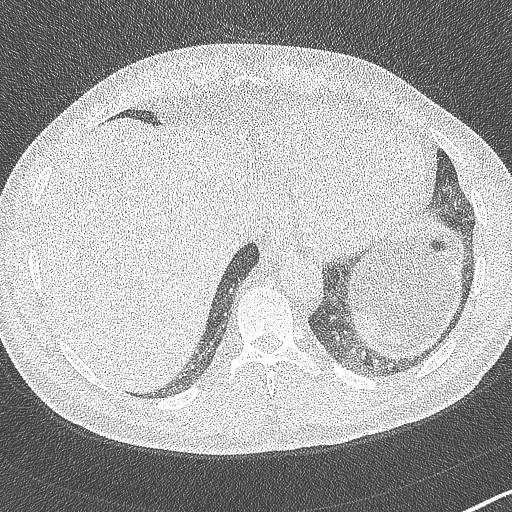
[im 127/310  mediastinal]
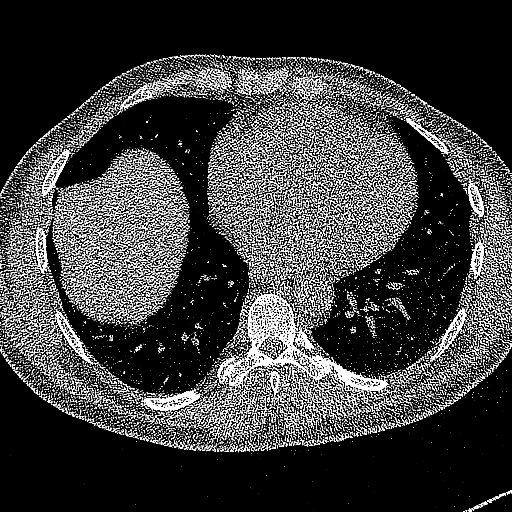
[im 127/310  lung]
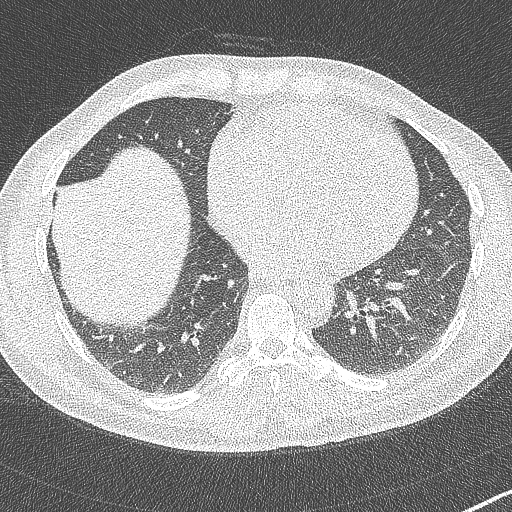
[im 155/310  lung]
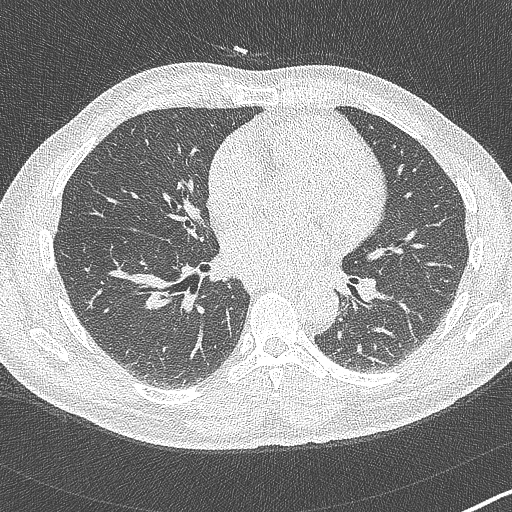
[im 183/310  lung]
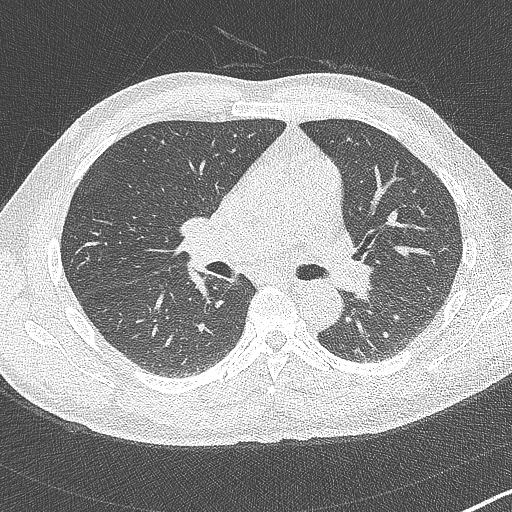
[im 211/310  lung]
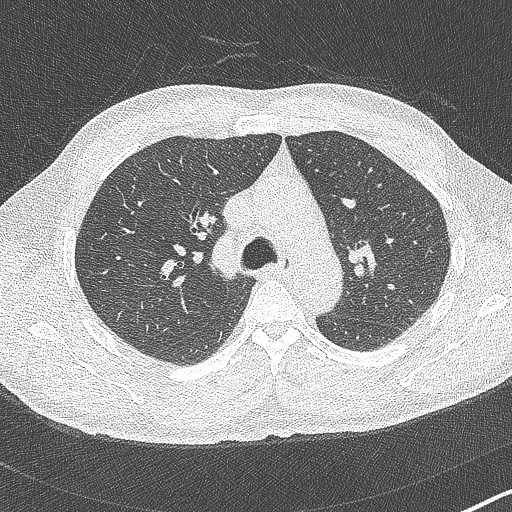
[im 239/310  mediastinal]
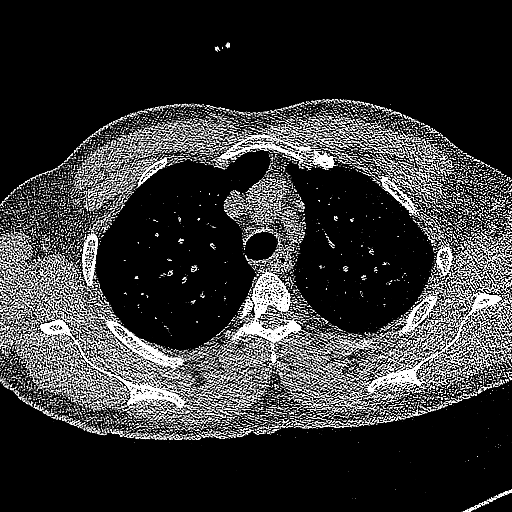
[im 239/310  lung]
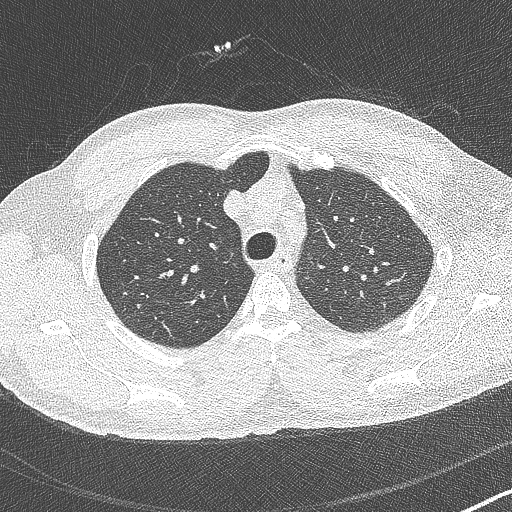
[im 267/310  lung]
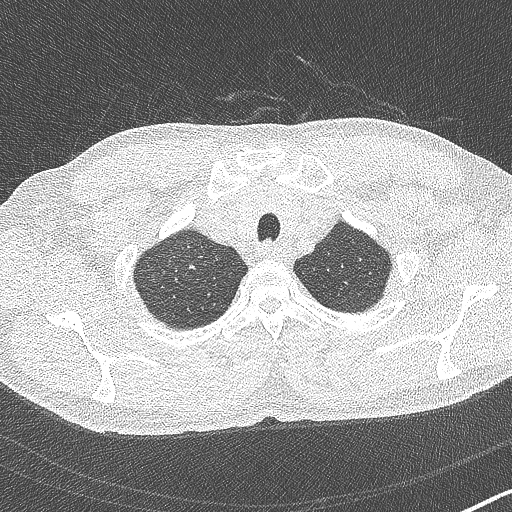
[im 295/310  lung]
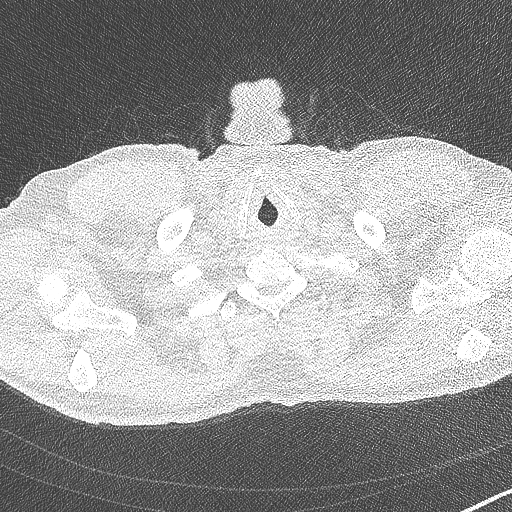

[Series 5: coronal · coronal · 0.64mm/px · 3 of 256 slices shown]
[im 52/256  lung]
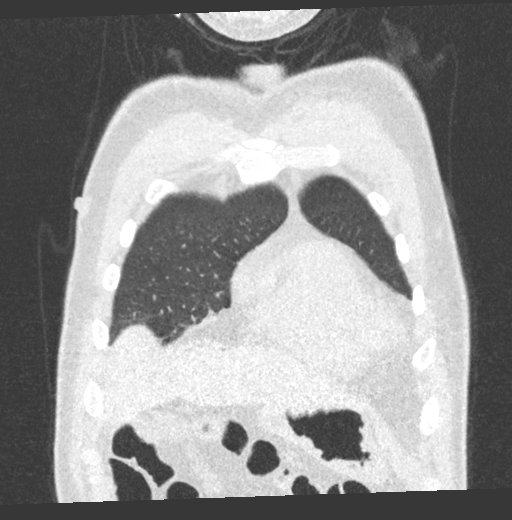
[im 103/256  lung]
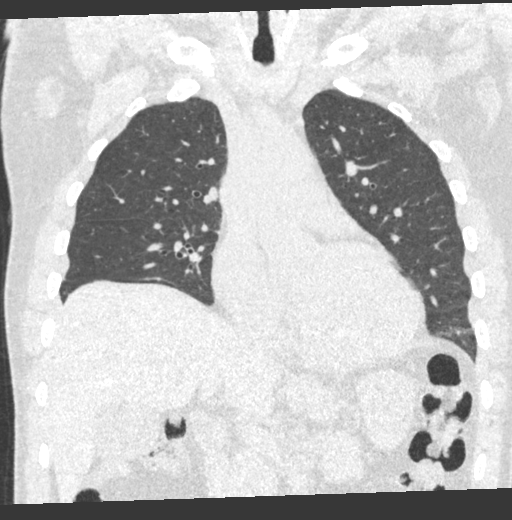
[im 154/256  lung]
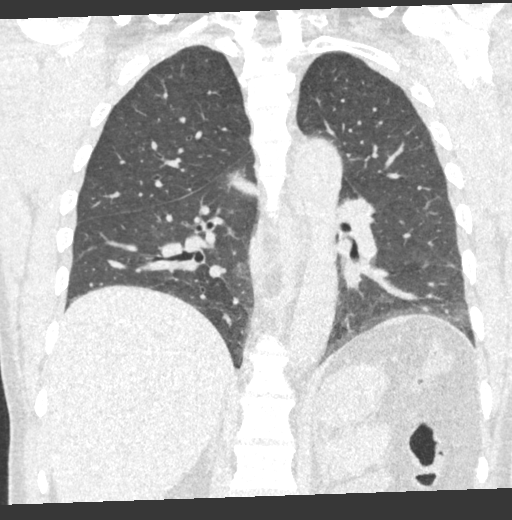

[14 of 36 positions shown; findings below may reference images not displayed]

FINDINGS: Cardiovascular: Normal aortic caliber. Normal heart size, without
pericardial effusion.

Mediastinum/Nodes: No mediastinal or definite hilar adenopathy,
given limitations of unenhanced CT.

Lungs/Pleura: No pleural fluid.  Clear lungs.

Upper Abdomen: Normal imaged portions of the liver, spleen, stomach,
adrenal glands, gallbladder.

Musculoskeletal: No acute osseous abnormality.
IMPRESSION: Lung-RADS 1, negative. Continue annual screening with low-dose chest
CT without contrast in 12 months.
FINDINGS: A 120 kV prospective scan was triggered in the descending thoracic
aorta at 111 HU's. Axial non-contrast 3 mm slices were carried out
through the heart. The data set was analyzed on a dedicated work
station and scored using the Agatson method. Gantry rotation speed
was 250 msecs and collimation was .6 mm. No beta blockade and 0.8 mg
of sl NTG was given. The 3D data set was reconstructed in 5%
intervals of the 67-82 % of the R-R cycle. Diastolic phases were
analyzed on a dedicated work station using MPR, MIP and VRT modes.
The patient received 80 cc of contrast.

Aorta: Normal size.  No calcifications.  No dissection.

Aortic Valve:  Trileaflet.  No calcifications.

Coronary Arteries:  Normal coronary origin.  Right dominance.

RCA is a large dominant artery that gives rise to PDA and PLVB.
There is no plaque.

Left main is a large artery that gives rise to LAD and LCX arteries.

LAD is a large vessel that has no plaque. There is a large D1 with
no plaque.

LCX is a non-dominant artery that gives rise to OM1 and OM2
branches. There is no plaque.

Coronary Calcium Score: 0

Percentile: 0

Other findings:

Normal pulmonary vein drainage into the left atrium.

Normal let atrial appendage without a thrombus.

Normal size of the pulmonary artery.
IMPRESSION: 1. Coronary calcium score of 0. This was 0 percentile for age-,
race-, and sex-matched controls.

2. Normal coronary origin with right dominance.  CAD-RADS 0.

3. No evidence of CAD.

4.  Consider non-cardiac causes of chest pain.

*** End of Addendum ***
FINDINGS: Cardiovascular: Normal aortic caliber. Normal heart size, without
pericardial effusion.

Mediastinum/Nodes: No mediastinal or definite hilar adenopathy,
given limitations of unenhanced CT.

Lungs/Pleura: No pleural fluid.  Clear lungs.

Upper Abdomen: Normal imaged portions of the liver, spleen, stomach,
adrenal glands, gallbladder.

Musculoskeletal: No acute osseous abnormality.
IMPRESSION: Lung-RADS 1, negative. Continue annual screening with low-dose chest
CT without contrast in 12 months.

## 2023-07-05 IMAGING — CT CT HEART MORP W/ CTA COR W/ SCORE W/ CA W/CM &/OR W/O CM
4 of 7 series · 8 of 20 positions shown, 9 images · IV contrast (APPLIED)
Comparison: Lung cancer screening CT 02/27/2021

EXAM:
OVER-READ INTERPRETATION  CT CHEST

The following report is an over-read performed by radiologist Dr.
Bambucafe Tarla [[email or web address]] on 03/02/2021. This over-read
does not include interpretation of cardiac or coronary anatomy or
pathology. The coronary CTA interpretation by the cardiologist is
attached.

[Series 6: ts diast sharp · axial · 0.39mm/px · z∈[-123,-84]mm · 2 of 293 slices shown]
[im 98/293  lung]
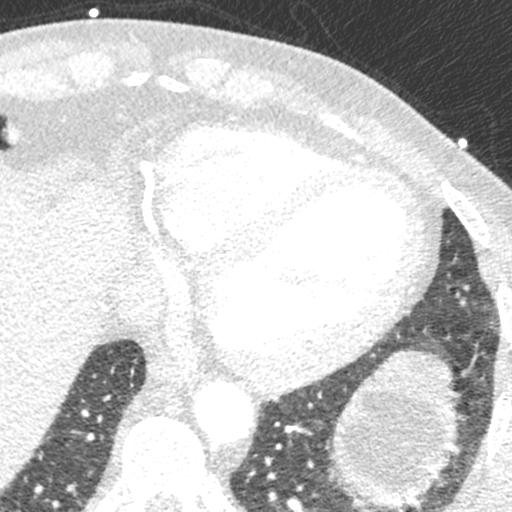
[im 195/293  lung]
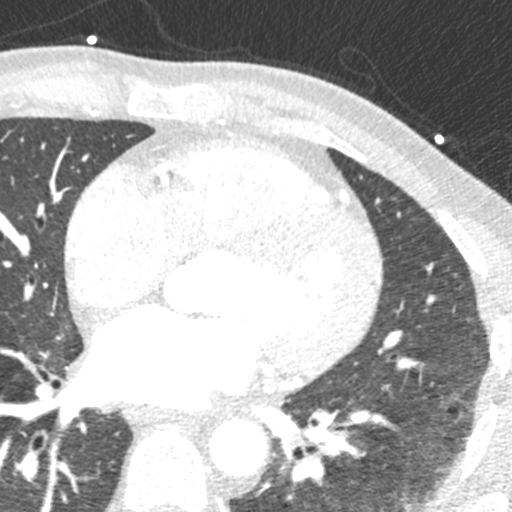

[Series 7: ts syst sharp · axial · 0.39mm/px · z∈[-123,-84]mm · 2 of 293 slices shown]
[im 98/293  lung]
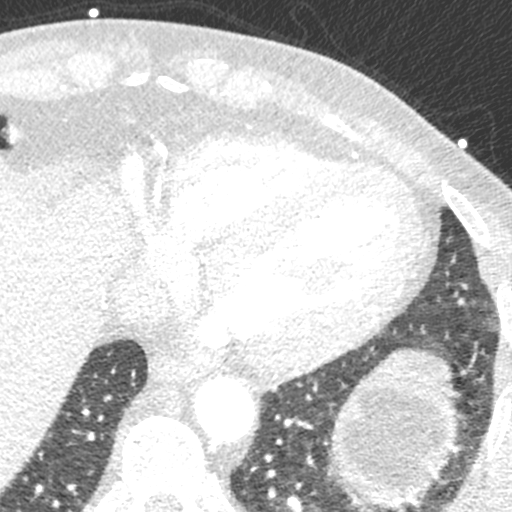
[im 195/293  lung]
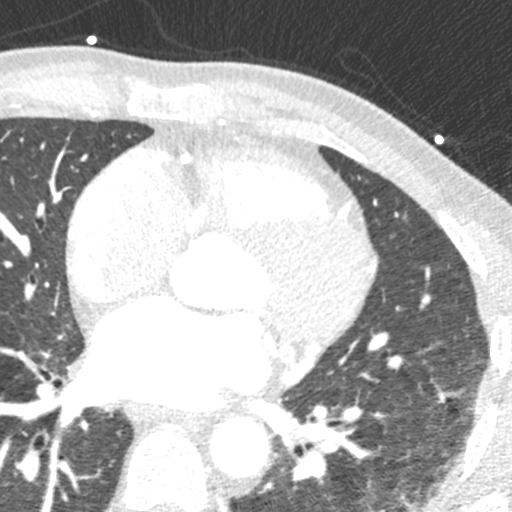

[Series 8: best syst · axial · 0.39mm/px · z∈[-123,-84]mm · 2 of 293 slices shown, 3 images]
[im 98/293  vessel]
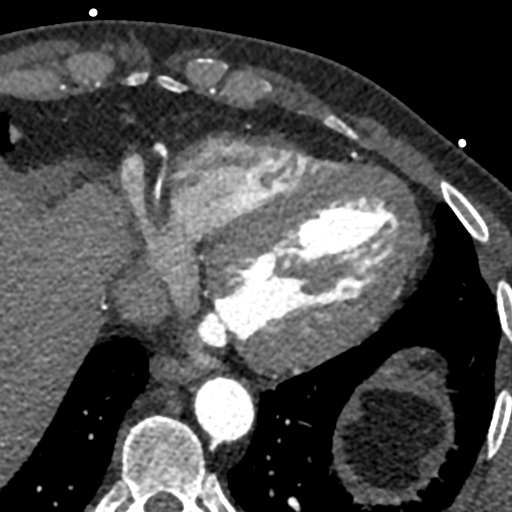
[im 98/293  lung]
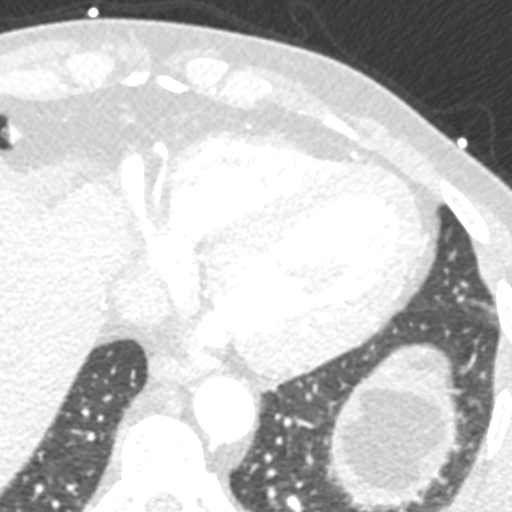
[im 195/293  vessel]
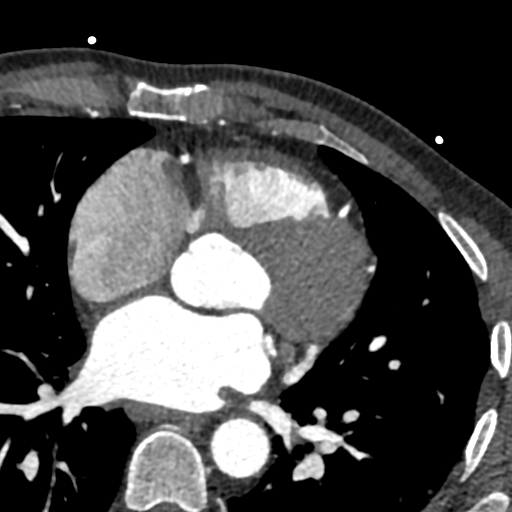

[Series 9: best diast · axial · 0.39mm/px · z∈[-123,-84]mm · 2 of 293 slices shown]
[im 98/293  vessel]
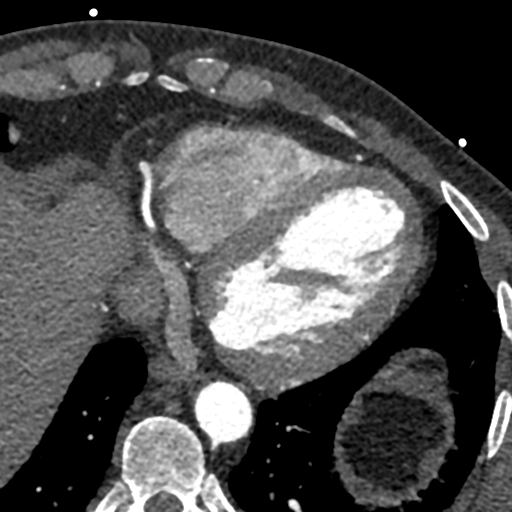
[im 195/293  vessel]
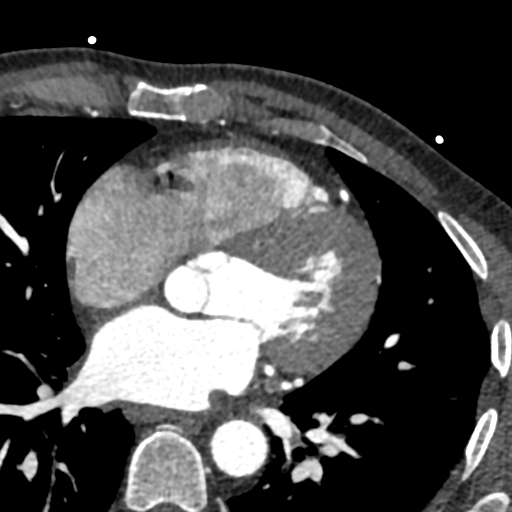

[8 of 20 positions shown; findings below may reference images not displayed]

FINDINGS: Vascular: Normal aortic caliber. No central pulmonary embolism, on
this non-dedicated study.

Mediastinum/Nodes: No mediastinal or hilar adenopathy.

Lungs/Pleura: No pleural fluid.  Clear imaged lungs.

Upper Abdomen: Normal imaged portions of the liver, spleen, stomach.

Musculoskeletal: No acute osseous abnormality.
IMPRESSION: No acute findings in the imaged extracardiac chest.

## 2023-07-18 ENCOUNTER — Ambulatory Visit: Attending: Neurosurgery | Admitting: Rehabilitation

## 2023-07-18 DIAGNOSIS — M542 Cervicalgia: Secondary | ICD-10-CM | POA: Insufficient documentation

## 2023-07-18 DIAGNOSIS — M6281 Muscle weakness (generalized): Secondary | ICD-10-CM | POA: Insufficient documentation

## 2023-07-18 DIAGNOSIS — M5412 Radiculopathy, cervical region: Secondary | ICD-10-CM | POA: Diagnosis not present

## 2023-07-18 DIAGNOSIS — M436 Torticollis: Secondary | ICD-10-CM | POA: Diagnosis not present
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
## 2023-07-19 ENCOUNTER — Encounter: Payer: Self-pay | Admitting: Rehabilitation

## 2023-07-25 ENCOUNTER — Ambulatory Visit: Admitting: Rehabilitation

## 2023-07-25 DIAGNOSIS — M436 Torticollis: Secondary | ICD-10-CM | POA: Diagnosis not present

## 2023-07-25 DIAGNOSIS — M6281 Muscle weakness (generalized): Secondary | ICD-10-CM

## 2023-07-25 DIAGNOSIS — M542 Cervicalgia: Secondary | ICD-10-CM

## 2023-07-25 DIAGNOSIS — M5412 Radiculopathy, cervical region: Secondary | ICD-10-CM
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
## 2023-07-29 ENCOUNTER — Ambulatory Visit: Admitting: Rehabilitation

## 2023-07-29 DIAGNOSIS — M6281 Muscle weakness (generalized): Secondary | ICD-10-CM | POA: Diagnosis not present

## 2023-07-29 DIAGNOSIS — M436 Torticollis: Secondary | ICD-10-CM | POA: Diagnosis not present

## 2023-07-29 DIAGNOSIS — M542 Cervicalgia: Secondary | ICD-10-CM

## 2023-07-29 DIAGNOSIS — M5412 Radiculopathy, cervical region: Secondary | ICD-10-CM
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
## 2023-07-31 ENCOUNTER — Ambulatory Visit

## 2023-07-31 DIAGNOSIS — M5412 Radiculopathy, cervical region: Secondary | ICD-10-CM | POA: Diagnosis not present

## 2023-07-31 DIAGNOSIS — M542 Cervicalgia: Secondary | ICD-10-CM

## 2023-07-31 DIAGNOSIS — M436 Torticollis: Secondary | ICD-10-CM | POA: Diagnosis not present

## 2023-07-31 DIAGNOSIS — M6281 Muscle weakness (generalized): Secondary | ICD-10-CM
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
## 2023-08-05 ENCOUNTER — Encounter: Payer: Self-pay | Admitting: Rehabilitation

## 2023-08-05 ENCOUNTER — Ambulatory Visit: Admitting: Rehabilitation

## 2023-08-05 DIAGNOSIS — M6281 Muscle weakness (generalized): Secondary | ICD-10-CM | POA: Diagnosis not present

## 2023-08-05 DIAGNOSIS — M5412 Radiculopathy, cervical region: Secondary | ICD-10-CM | POA: Diagnosis not present

## 2023-08-05 DIAGNOSIS — M436 Torticollis: Secondary | ICD-10-CM | POA: Diagnosis not present

## 2023-08-05 DIAGNOSIS — M542 Cervicalgia: Secondary | ICD-10-CM | POA: Diagnosis not present
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
## 2023-08-06 DIAGNOSIS — Z6826 Body mass index (BMI) 26.0-26.9, adult: Secondary | ICD-10-CM | POA: Diagnosis not present

## 2023-08-06 DIAGNOSIS — M4722 Other spondylosis with radiculopathy, cervical region: Secondary | ICD-10-CM | POA: Diagnosis not present

## 2023-08-07 ENCOUNTER — Ambulatory Visit

## 2023-08-07 DIAGNOSIS — M5412 Radiculopathy, cervical region: Secondary | ICD-10-CM | POA: Diagnosis not present

## 2023-08-07 DIAGNOSIS — M542 Cervicalgia: Secondary | ICD-10-CM | POA: Diagnosis not present

## 2023-08-07 DIAGNOSIS — M6281 Muscle weakness (generalized): Secondary | ICD-10-CM

## 2023-08-07 DIAGNOSIS — M436 Torticollis: Secondary | ICD-10-CM
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
## 2023-08-12 ENCOUNTER — Ambulatory Visit: Attending: Neurosurgery | Admitting: Rehabilitation

## 2023-08-12 ENCOUNTER — Other Ambulatory Visit: Payer: Self-pay

## 2023-08-12 DIAGNOSIS — M542 Cervicalgia: Secondary | ICD-10-CM | POA: Insufficient documentation

## 2023-08-12 DIAGNOSIS — M436 Torticollis: Secondary | ICD-10-CM | POA: Insufficient documentation

## 2023-08-12 DIAGNOSIS — M5412 Radiculopathy, cervical region: Secondary | ICD-10-CM | POA: Insufficient documentation

## 2023-08-12 DIAGNOSIS — M6281 Muscle weakness (generalized): Secondary | ICD-10-CM | POA: Diagnosis not present
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
## 2023-08-14 MED ORDER — NITROGLYCERIN 0.4 MG SL SUBL: 0.4000 mg | SUBLINGUAL_TABLET | SUBLINGUAL | 1 refills | Status: AC | PRN

## 2023-08-15 ENCOUNTER — Ambulatory Visit: Admitting: Rehabilitation

## 2023-08-15 DIAGNOSIS — M436 Torticollis: Secondary | ICD-10-CM

## 2023-08-15 DIAGNOSIS — M5412 Radiculopathy, cervical region: Secondary | ICD-10-CM

## 2023-08-15 DIAGNOSIS — M542 Cervicalgia: Secondary | ICD-10-CM | POA: Diagnosis not present

## 2023-08-15 DIAGNOSIS — M6281 Muscle weakness (generalized): Secondary | ICD-10-CM
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
## 2023-08-16 ENCOUNTER — Encounter (HOSPITAL_BASED_OUTPATIENT_CLINIC_OR_DEPARTMENT_OTHER): Payer: Self-pay | Admitting: Neurosurgery

## 2023-08-16 ENCOUNTER — Other Ambulatory Visit (HOSPITAL_BASED_OUTPATIENT_CLINIC_OR_DEPARTMENT_OTHER): Payer: Self-pay | Admitting: Neurosurgery

## 2023-08-16 DIAGNOSIS — M4722 Other spondylosis with radiculopathy, cervical region: Secondary | ICD-10-CM

## 2023-08-18 ENCOUNTER — Ambulatory Visit (HOSPITAL_BASED_OUTPATIENT_CLINIC_OR_DEPARTMENT_OTHER)
Admission: RE | Admit: 2023-08-18 | Discharge: 2023-08-18 | Disposition: A | Discharge status: Home / Self Care | Source: Ambulatory Visit | Attending: Neurosurgery | Admitting: Neurosurgery

## 2023-08-18 DIAGNOSIS — M47812 Spondylosis without myelopathy or radiculopathy, cervical region: Secondary | ICD-10-CM | POA: Diagnosis not present

## 2023-08-18 DIAGNOSIS — M4722 Other spondylosis with radiculopathy, cervical region: Secondary | ICD-10-CM | POA: Insufficient documentation

## 2023-08-18 DIAGNOSIS — M50222 Other cervical disc displacement at C5-C6 level: Secondary | ICD-10-CM | POA: Diagnosis not present

## 2023-08-18 DIAGNOSIS — M50221 Other cervical disc displacement at C4-C5 level: Secondary | ICD-10-CM | POA: Diagnosis not present

## 2023-08-18 DIAGNOSIS — M4802 Spinal stenosis, cervical region: Secondary | ICD-10-CM | POA: Diagnosis not present

## 2023-09-03 DIAGNOSIS — Z6826 Body mass index (BMI) 26.0-26.9, adult: Secondary | ICD-10-CM | POA: Diagnosis not present

## 2023-09-03 DIAGNOSIS — M4802 Spinal stenosis, cervical region: Secondary | ICD-10-CM | POA: Diagnosis not present

## 2023-09-06 ENCOUNTER — Other Ambulatory Visit: Payer: Self-pay | Admitting: Neurosurgery

## 2023-09-10 ENCOUNTER — Other Ambulatory Visit: Payer: Self-pay | Admitting: Student

## 2023-09-10 DIAGNOSIS — I10 Essential (primary) hypertension: Secondary | ICD-10-CM

## 2023-09-10 NOTE — Telephone Encounter (Signed)
 Medication sent to pharmacy

## 2023-09-16 NOTE — Pre-Procedure Instructions (Signed)
 Surgical Instructions   Your procedure is scheduled on Friday, September 20, 2023. Report to [facility] Main Entrance A at 8:20 A.M., then check in with the Admitting office. Any questions or running late day of surgery: call [phone]  Questions prior to your surgery date: call [phone], Monday-Friday, 8am-4pm. If you experience any cold or flu symptoms such as cough, fever, chills, shortness of breath, etc. between now and your scheduled surgery, please notify us  at the above number.     Remember:  Do not eat or drink after midnight the night before your surgery    Take these medicines the morning of surgery with A SIP OF WATER : Rosuvastatin  (Crestor )   May take these medicines IF NEEDED: Cetirizine  (Zyrtec ) Nitroglycerin  (Nitrostat )-- please let your nurse know if you take this  Follow your surgeon's instructions when to STOP Aspirin .  If no instructions were given by your surgeon, then you will need to call the office to obtain instructions.    One week prior to surgery, STOP taking any Aleve, Naproxen, Ibuprofen , Motrin , Advil , Goody's, BC's, all herbal medications, fish oil, and non-prescription vitamins.                     Do NOT Smoke (Tobacco/Vaping) for 24 hours prior to your procedure.  If you use a CPAP at night, you may bring your mask/headgear for your overnight stay.   You will be asked to remove any contacts, glasses, piercing's, hearing aid's, dentures/partials prior to surgery. Please bring cases for these items if needed.    Patients discharged the day of surgery will not be allowed to drive home, and someone needs to stay with them for 24 hours.  SURGICAL WAITING ROOM VISITATION Patients may have no more than 2 support people in the waiting area - these visitors may rotate.   Pre-op nurse will coordinate an appropriate time for 1 ADULT support person, who may not rotate, to accompany patient in pre-op.  Children under the age of 75 must have an  adult with them who is not the patient and must remain in the main waiting area with an adult.  If the patient needs to stay at the hospital during part of their recovery, the visitor guidelines for inpatient rooms apply.  Please refer to the [facility] website for the visitor guidelines for any additional information.   If you received a COVID test during your pre-op visit  it is requested that you wear a mask when out in public, stay away from anyone that may not be feeling well and notify your surgeon if you develop symptoms. If you have been in contact with anyone that has tested positive in the last 10 days please notify you surgeon.      Pre-operative 5 CHG Bathing Instructions   You can play a key role in reducing the risk of infection after surgery. Your skin needs to be as free of germs as possible. You can reduce the number of germs on your skin by washing with CHG (chlorhexidine  gluconate) soap before surgery. CHG is an antiseptic soap that kills germs and continues to kill germs even after washing.   DO NOT use if you have an allergy to chlorhexidine /CHG or antibacterial soaps. If your skin becomes reddened or irritated, stop using the CHG and notify one of our RNs at [phone].   Please shower with the CHG soap starting 4 days before surgery using the following schedule:     Please keep  in mind the following:  DO NOT shave, including legs and underarms, starting the day of your first shower.   You may shave your face at any point before/day of surgery.  Place clean sheets on your bed the day you start using CHG soap. Use a clean washcloth (not used since being washed) for each shower. DO NOT sleep with pets once you start using the CHG.   CHG Shower Instructions:  Wash your face and private area with normal soap. If you choose to wash your hair, wash first with your normal shampoo.  After you use shampoo/soap, rinse your hair and body thoroughly to remove shampoo/soap  residue.  Turn the water OFF and apply about 3 tablespoons (45 ml) of CHG soap to a CLEAN washcloth.  Apply CHG soap ONLY FROM YOUR NECK DOWN TO YOUR TOES (washing for 3-5 minutes)  DO NOT use CHG soap on face, private areas, open wounds, or sores.  Pay special attention to the area where your surgery is being performed.  If you are having back surgery, having someone wash your back for you may be helpful. Wait 2 minutes after CHG soap is applied, then you may rinse off the CHG soap.  Pat dry with a clean towel  Put on clean clothes/pajamas   If you choose to wear lotion, please use ONLY the CHG-compatible lotions that are listed below.  Additional instructions for the day of surgery: DO NOT APPLY any lotions, deodorants, cologne, or perfumes.   Do not bring valuables to the hospital. [facility] is not responsible for any belongings/valuables. Do not wear nail polish, gel polish, artificial nails, or any other type of covering on natural nails (fingers and toes) Do not wear jewelry or makeup Put on clean/comfortable clothes.  Please brush your teeth.  Ask your nurse before applying any prescription medications to the skin.     CHG Compatible Lotions   Aveeno Moisturizing lotion  Cetaphil Moisturizing Cream  Cetaphil Moisturizing Lotion  Clairol Herbal Essence Moisturizing Lotion, Dry Skin  Clairol Herbal Essence Moisturizing Lotion, Extra Dry Skin  Clairol Herbal Essence Moisturizing Lotion, Normal Skin  Curel Age Defying Therapeutic Moisturizing Lotion with Alpha Hydroxy  Curel Extreme Care Body Lotion  Curel Soothing Hands Moisturizing Hand Lotion  Curel Therapeutic Moisturizing Cream, Fragrance-Free  Curel Therapeutic Moisturizing Lotion, Fragrance-Free  Curel Therapeutic Moisturizing Lotion, Original Formula  Eucerin Daily Replenishing Lotion  Eucerin Dry Skin Therapy Plus Alpha Hydroxy Crme  Eucerin Dry Skin Therapy Plus Alpha Hydroxy Lotion  Eucerin Original Crme   Eucerin Original Lotion  Eucerin Plus Crme Eucerin Plus Lotion  Eucerin TriLipid Replenishing Lotion  Keri Anti-Bacterial Hand Lotion  Keri Deep Conditioning Original Lotion Dry Skin Formula Softly Scented  Keri Deep Conditioning Original Lotion, Fragrance Free Sensitive Skin Formula  Keri Lotion Fast Absorbing Fragrance Free Sensitive Skin Formula  Keri Lotion Fast Absorbing Softly Scented Dry Skin Formula  Keri Original Lotion  Keri Skin Renewal Lotion Keri Silky Smooth Lotion  Keri Silky Smooth Sensitive Skin Lotion  Nivea Body Creamy Conditioning Oil  Nivea Body Extra Enriched Lotion  Nivea Body Original Lotion  Nivea Body Sheer Moisturizing Lotion Nivea Crme  Nivea Skin Firming Lotion  NutraDerm 30 Skin Lotion  NutraDerm Skin Lotion  NutraDerm Therapeutic Skin Cream  NutraDerm Therapeutic Skin Lotion  ProShield Protective Hand Cream  Provon moisturizing lotion  Please read over the following fact sheets that you were given.

## 2023-09-17 ENCOUNTER — Encounter (HOSPITAL_COMMUNITY)
Admission: RE | Admit: 2023-09-17 | Discharge: 2023-09-17 | Disposition: A | Discharge status: Home / Self Care | Source: Ambulatory Visit | Attending: Neurosurgery | Admitting: Neurosurgery

## 2023-09-17 ENCOUNTER — Other Ambulatory Visit: Payer: Self-pay

## 2023-09-17 ENCOUNTER — Encounter (HOSPITAL_COMMUNITY): Payer: Self-pay

## 2023-09-17 VITALS — BP 166/108 | HR 83 | Temp 98.0°F | Resp 18 | Ht 71.0 in | Wt 189.2 lb

## 2023-09-17 DIAGNOSIS — Q631 Lobulated, fused and horseshoe kidney: Secondary | ICD-10-CM | POA: Insufficient documentation

## 2023-09-17 DIAGNOSIS — R0789 Other chest pain: Secondary | ICD-10-CM | POA: Diagnosis not present

## 2023-09-17 DIAGNOSIS — J45909 Unspecified asthma, uncomplicated: Secondary | ICD-10-CM | POA: Diagnosis not present

## 2023-09-17 DIAGNOSIS — Z79899 Other long term (current) drug therapy: Secondary | ICD-10-CM | POA: Diagnosis not present

## 2023-09-17 DIAGNOSIS — Z01818 Encounter for other preprocedural examination: Secondary | ICD-10-CM | POA: Insufficient documentation

## 2023-09-17 DIAGNOSIS — Z8673 Personal history of transient ischemic attack (TIA), and cerebral infarction without residual deficits: Secondary | ICD-10-CM | POA: Diagnosis not present

## 2023-09-17 DIAGNOSIS — Z720 Tobacco use: Secondary | ICD-10-CM | POA: Diagnosis not present

## 2023-09-17 DIAGNOSIS — M25519 Pain in unspecified shoulder: Secondary | ICD-10-CM | POA: Diagnosis not present

## 2023-09-17 DIAGNOSIS — G9589 Other specified diseases of spinal cord: Secondary | ICD-10-CM | POA: Insufficient documentation

## 2023-09-17 DIAGNOSIS — M4802 Spinal stenosis, cervical region: Secondary | ICD-10-CM | POA: Insufficient documentation

## 2023-09-17 DIAGNOSIS — E785 Hyperlipidemia, unspecified: Secondary | ICD-10-CM | POA: Diagnosis not present

## 2023-09-17 DIAGNOSIS — I10 Essential (primary) hypertension: Secondary | ICD-10-CM | POA: Diagnosis not present

## 2023-09-17 HISTORY — DX: Unspecified asthma, uncomplicated: J45.909

## 2023-09-17 HISTORY — DX: Personal history of other diseases of the circulatory system: Z86.79

## 2023-09-17 LAB — CBC
HCT: 46.6 % (ref 39.0–52.0)
Hemoglobin: 15 g/dL (ref 13.0–17.0)
MCH: 29.9 pg (ref 26.0–34.0)
MCHC: 32.2 g/dL (ref 30.0–36.0)
MCV: 92.8 fL (ref 80.0–100.0)
Platelets: 266 K/uL (ref 150–400)
RBC: 5.02 MIL/uL (ref 4.22–5.81)
RDW: 14.1 % (ref 11.5–15.5)
WBC: 8.3 K/uL (ref 4.0–10.5)
nRBC: 0 % (ref 0.0–0.2)

## 2023-09-17 LAB — BASIC METABOLIC PANEL WITH GFR
Anion gap: 10 (ref 5–15)
BUN: 13 mg/dL (ref 6–20)
CO2: 22 mmol/L (ref 22–32)
Calcium: 9.7 mg/dL (ref 8.9–10.3)
Chloride: 105 mmol/L (ref 98–111)
Creatinine, Ser: 1.06 mg/dL (ref 0.61–1.24)
GFR, Estimated: >60 mL/min (ref >60)
Glucose, Bld: 77 mg/dL (ref 70–99)
Potassium: 4 mmol/L (ref 3.5–5.1)
Sodium: 137 mmol/L (ref 135–145)

## 2023-09-17 LAB — TYPE AND SCREEN
ABO/RH(D): O POS
Antibody Screen: NEGATIVE

## 2023-09-17 LAB — SURGICAL PCR SCREEN
MRSA, PCR: NEGATIVE
Staphylococcus aureus: NEGATIVE

## 2023-09-17 NOTE — Progress Notes (Signed)
 PCP - Jolaine Pac, DO  Cardiologist -   PPM/ICD -  Device Orders -  Rep Notified -   Chest x-ray -  EKG - 09-17-23 Stress Test -  ECHO - 03-10-2021 Cardiac Cath -   Sleep Study - per patient he had a sleep test about a year ago. He reported he did need any but could not avoid at that time CPAP -   DM -denies  Blood Thinner Instructions:denies Aspirin  Instructions:Instructed patient to reach out to surgeon for further instructions  ERAS Protcol -NPO PRE-SURGERY Ensure or G2-   COVID TEST- yes   Anesthesia review: Yes, hx of HTN, MI, Stroke, also reported the use of nitroglycerin  last month. Jesse Hope PA. With anesthesia into spoke with patient   Patient denies shortness of breath, fever, cough and chest pain at PAT appointment   All instructions explained to the patient, with a verbal understanding of the material. Patient agrees to go over the instructions while at home for a better understanding. Patient also instructed to self quarantine after being tested for COVID-19. The opportunity to ask questions was provided.

## 2023-09-18 NOTE — Progress Notes (Signed)
 Anesthesia Chart Review:  58 year old male current smoker with pertinent history including asthma, HTN, HLD, TIA, horseshoe kidney.  Patient has had multiple prior evaluations for chest pain.  Coronary CTA 07/17/2017 showed calcium  score of 0 with no significant coronary disease.  More recently, he was seen by his PCP at the resident clinic at [facility] on 01/17/2021 and complained of substernal chest pressure.  At that time he was prescribed nitroglycerin  and referred to cardiology for evaluation.  He was subsequently seen by Dr. Wendel on 02/17/2021.  Coronary CTA and echo were ordered.  CTA 03/02/2021 and again showed coronary calcium  score of 0 and no CAD.  Echo 03/10/2021 showed LVEF 50 to 55%, grade 1 DD, normal RV, no significant valvular abnormalities.  He was recommended to optimize blood pressure control and follow-up with cardiology on an as-needed basis.  At preadmission testing appointment, patient reported taking a dose of nitroglycerin  approximately 1 month ago.  He stated he was exerting himself and felt like his heart rate was too high so he took nitroglycerin .  He does not report having any exertional chest discomfort.  His functional status is fairly low, but he does state that he fell 2 flights of stairs.  Upon further questioning, the patient states that when he was seen by his PCP back on 01/17/2021 with initial complaint of substernal chest pain, he was prescribed nitroglycerin  and advised not to perform heavy exertion or allow his heart rate to get too high.  He evidently took these recommendations to be indefinite.  Despite benign echocardiogram and coronary CTA with no evidence of CAD, he has continued to limit his physical exertion and is wary of increasing his heart rate due to perceived cardiac risk.  Long discussion about this today.  I advised him that his prior testing indicates shows normal anatomy and no evidence of coronary artery disease.  Based on this, he should not have any  cardiac limitations on exertion.  I advised him to discuss nitroglycerin  use with his PCP as I do not think he currently has an indication for this.  He has severe stenosis in his cervical spine with evidence of chronic myelomalacia and I would not be surprised if some of his intermittent chest/shoulder discomfort are more related to this rather than any cardiac etiology.  He was also encouraged to quit smoking.  Patient verbalized understanding of these recommendations.  Hypertension not well-controlled, blood pressure 172/105 on arrival at 166/108 on recheck.  He reports compliance with losartan  100 mg daily and HCTZ 25 mg daily.  He states he takes these medications at night.  He says he does have a way to check this at home but has not been doing so lately.  I encouraged him to keep a log of blood pressures and follow-up with his primary care physician as he may need medication titration.  He also understands the markedly uncontrolled blood pressure on day of surgery could be cause for cancellation.  Preop labs reviewed, WNL.  Case reviewed with anesthesiologist Dr. Treen.  Advised okay to proceed as planned barring acute status change.  EKG 09/17/2023: Normal sinus rhythm.  Rate 71. Possible Left atrial enlargement. Nonspecific T wave abnormality  TTE 03/10/2021: 1. Left ventricular ejection fraction, by estimation, is 50 to 55%. The  left ventricle has low normal function. The left ventricle has no regional  wall motion abnormalities. There is mild left ventricular hypertrophy.  Left ventricular diastolic  parameters are consistent with Grade I diastolic dysfunction (impaired  relaxation). The average left ventricular global longitudinal strain is  -21.1 %. The global longitudinal strain is normal.   2. Right ventricular systolic function is normal. The right ventricular  size is normal.   3. The mitral valve is normal in structure. Trivial mitral valve  regurgitation. No evidence of mitral  stenosis.   4. The aortic valve is tricuspid. Aortic valve regurgitation is not  visualized. Aortic valve sclerosis is present, with no evidence of aortic  valve stenosis.   5. The inferior vena cava is normal in size with greater than 50%  respiratory variability, suggesting right atrial pressure of 3 mmHg.   Coronary CTA 03/02/2021: IMPRESSION: 1. Coronary calcium  score of 0. This was 0 percentile for age-, race-, and sex-matched controls.   2. Normal coronary origin with right dominance.  CAD-RADS 0.   3. No evidence of CAD.   4.  Consider non-cardiac causes of chest pain.     Lynwood Geofm RIGGERS [facility] Short Stay Center/Anesthesiology Phone [phone] 09/18/2023 2:38 PM

## 2023-09-18 NOTE — Anesthesia Preprocedure Evaluation (Signed)
 Anesthesia Evaluation  Patient identified by MRN, date of birth, ID band Patient awake    Reviewed: Allergy & Precautions, NPO status , Patient's Chart, lab work & pertinent test results, reviewed documented beta blocker date and time   Airway Mallampati: III  TM Distance: >3 FB     Dental  (+) Poor Dentition,    Pulmonary asthma , sleep apnea and Continuous Positive Airway Pressure Ventilation , Current Smoker and Patient abstained from smoking.   breath sounds clear to auscultation       Cardiovascular hypertension, + angina  (-) CAD, (-) Past MI, (-) Cardiac Stents and (-) CABG  Rhythm:Regular Rate:Normal     Neuro/Psych  Headaches, neg Seizures TIA Neuromuscular disease    GI/Hepatic ,neg GERD  ,,(+) neg Cirrhosis        Endo/Other    Renal/GU Renal disease     Musculoskeletal   Abdominal   Peds  Hematology   Anesthesia Other Findings   Reproductive/Obstetrics                              Anesthesia Physical Anesthesia Plan  ASA: 2  Anesthesia Plan: General   Post-op Pain Management:    Induction: Intravenous  PONV Risk Score and Plan: 2 and Ondansetron  and Dexamethasone   Airway Management Planned: Oral ETT and Video Laryngoscope Planned  Additional Equipment:   Intra-op Plan:   Post-operative Plan: Extubation in OR  Informed Consent: I have reviewed the patients History and Physical, chart, labs and discussed the procedure including the risks, benefits and alternatives for the proposed anesthesia with the patient or authorized representative who has indicated his/her understanding and acceptance.       Plan Discussed with: CRNA  Anesthesia Plan Comments: (PAT note by Lynwood Hope, PA-C: 58 year old male current smoker with pertinent history including asthma, HTN, HLD, TIA, horseshoe kidney.  Patient has had multiple prior evaluations for chest pain.  Coronary CTA  07/17/2017 showed calcium  score of 0 with no significant coronary disease.  More recently, he was seen by his PCP at the resident clinic at [facility] on 01/17/2021 and complained of substernal chest pressure.  At that time he was prescribed nitroglycerin  and referred to cardiology for evaluation.  He was subsequently seen by Dr. Wendel on 02/17/2021.  Coronary CTA and echo were ordered.  CTA 03/02/2021 and again showed coronary calcium  score of 0 and no CAD.  Echo 03/10/2021 showed LVEF 50 to 55%, grade 1 DD, normal RV, no significant valvular abnormalities.  He was recommended to optimize blood pressure control and follow-up with cardiology on an as-needed basis.  At preadmission testing appointment, patient reported taking a dose of nitroglycerin  approximately 1 month ago.  He stated he was exerting himself and felt like his heart rate was too high so he took nitroglycerin .  He does not report having any exertional chest discomfort.  His functional status is fairly low, but he does state that he fell 2 flights of stairs.  Upon further questioning, the patient states that when he was seen by his PCP back on 01/17/2021 with initial complaint of substernal chest pain, he was prescribed nitroglycerin  and advised not to perform heavy exertion or allow his heart rate to get too high.  He evidently took these recommendations to be indefinite.  Despite benign echocardiogram and coronary CTA with no evidence of CAD, he has continued to limit his physical exertion and is wary of increasing his  heart rate due to perceived cardiac risk.  Long discussion about this today.  I advised him that his prior testing indicates shows normal anatomy and no evidence of coronary artery disease.  Based on this, he should not have any cardiac limitations on exertion.  I advised him to discuss nitroglycerin  use with his PCP as I do not think he currently has an indication for this.  He has severe stenosis in his cervical spine with evidence of  chronic myelomalacia and I would not be surprised if some of his intermittent chest/shoulder discomfort are more related to this rather than any cardiac etiology.  He was also encouraged to quit smoking.  Patient verbalized understanding of these recommendations.  Hypertension not well-controlled, blood pressure 172/105 on arrival at 166/108 on recheck.  He reports compliance with losartan  100 mg daily and HCTZ 25 mg daily.  He states he takes these medications at night.  He says he does have a way to check this at home but has not been doing so lately.  I encouraged him to keep a log of blood pressures and follow-up with his primary care physician as he may need medication titration.  He also understands the markedly uncontrolled blood pressure on day of surgery could be cause for cancellation.  Preop labs reviewed, WNL.  Case reviewed with anesthesiologist Dr. Treen.  Advised okay to proceed as planned barring acute status change.  EKG 09/17/2023: Normal sinus rhythm.  Rate 71. Possible Left atrial enlargement. Nonspecific T wave abnormality  TTE 03/10/2021: 1. Left ventricular ejection fraction, by estimation, is 50 to 55%. The  left ventricle has low normal function. The left ventricle has no regional  wall motion abnormalities. There is mild left ventricular hypertrophy.  Left ventricular diastolic  parameters are consistent with Grade I diastolic dysfunction (impaired  relaxation). The average left ventricular global longitudinal strain is  -21.1 %. The global longitudinal strain is normal.  2. Right ventricular systolic function is normal. The right ventricular  size is normal.  3. The mitral valve is normal in structure. Trivial mitral valve  regurgitation. No evidence of mitral stenosis.  4. The aortic valve is tricuspid. Aortic valve regurgitation is not  visualized. Aortic valve sclerosis is present, with no evidence of aortic  valve stenosis.  5. The inferior vena cava is normal  in size with greater than 50%  respiratory variability, suggesting right atrial pressure of 3 mmHg.   Coronary CTA 03/02/2021: IMPRESSION: 1. Coronary calcium  score of 0. This was 0 percentile for age-, race-, and sex-matched controls.  2. Normal coronary origin with right dominance.  CAD-RADS 0.  3. No evidence of CAD.  4.  Consider non-cardiac causes of chest pain.   )         Anesthesia Quick Evaluation

## 2023-09-20 ENCOUNTER — Ambulatory Visit (HOSPITAL_COMMUNITY)
Admission: RE | Admit: 2023-09-20 | Discharge: 2023-09-21 | Disposition: A | Discharge status: Home / Self Care | Attending: Neurosurgery | Admitting: Neurosurgery

## 2023-09-20 ENCOUNTER — Other Ambulatory Visit: Payer: Self-pay

## 2023-09-20 ENCOUNTER — Encounter (HOSPITAL_COMMUNITY): Payer: Self-pay | Admitting: Physician Assistant

## 2023-09-20 ENCOUNTER — Ambulatory Visit (HOSPITAL_COMMUNITY)
Admission: RE | Disposition: A | Discharge status: Home / Self Care | Payer: Self-pay | Source: Home / Self Care | Attending: Neurosurgery

## 2023-09-20 ENCOUNTER — Encounter (HOSPITAL_COMMUNITY): Payer: Self-pay | Admitting: Neurosurgery

## 2023-09-20 ENCOUNTER — Ambulatory Visit (HOSPITAL_COMMUNITY)

## 2023-09-20 ENCOUNTER — Ambulatory Visit (HOSPITAL_COMMUNITY): Payer: Self-pay

## 2023-09-20 DIAGNOSIS — I10 Essential (primary) hypertension: Secondary | ICD-10-CM | POA: Insufficient documentation

## 2023-09-20 DIAGNOSIS — G4730 Sleep apnea, unspecified: Secondary | ICD-10-CM | POA: Insufficient documentation

## 2023-09-20 DIAGNOSIS — G4733 Obstructive sleep apnea (adult) (pediatric): Secondary | ICD-10-CM

## 2023-09-20 DIAGNOSIS — I209 Angina pectoris, unspecified: Secondary | ICD-10-CM | POA: Diagnosis not present

## 2023-09-20 DIAGNOSIS — M4802 Spinal stenosis, cervical region: Secondary | ICD-10-CM | POA: Insufficient documentation

## 2023-09-20 DIAGNOSIS — F17200 Nicotine dependence, unspecified, uncomplicated: Secondary | ICD-10-CM | POA: Insufficient documentation

## 2023-09-20 DIAGNOSIS — R519 Headache, unspecified: Secondary | ICD-10-CM | POA: Insufficient documentation

## 2023-09-20 DIAGNOSIS — F17210 Nicotine dependence, cigarettes, uncomplicated: Secondary | ICD-10-CM | POA: Diagnosis not present

## 2023-09-20 DIAGNOSIS — Z01818 Encounter for other preprocedural examination: Secondary | ICD-10-CM

## 2023-09-20 DIAGNOSIS — G992 Myelopathy in diseases classified elsewhere: Secondary | ICD-10-CM | POA: Diagnosis not present

## 2023-09-20 DIAGNOSIS — Z79899 Other long term (current) drug therapy: Secondary | ICD-10-CM | POA: Diagnosis not present

## 2023-09-20 DIAGNOSIS — J45909 Unspecified asthma, uncomplicated: Secondary | ICD-10-CM | POA: Diagnosis not present

## 2023-09-20 DIAGNOSIS — Z4789 Encounter for other orthopedic aftercare: Secondary | ICD-10-CM | POA: Diagnosis not present

## 2023-09-20 DIAGNOSIS — G9520 Unspecified cord compression: Secondary | ICD-10-CM | POA: Diagnosis present

## 2023-09-20 HISTORY — PX: ANTERIOR CERVICAL CORPECTOMY: SHX1159

## 2023-09-20 LAB — ABO/RH: ABO/RH(D): O POS

## 2023-09-20 SURGERY — ANTERIOR CERVICAL CORPECTOMY
Anesthesia: General

## 2023-09-20 MED ORDER — ONDANSETRON HCL 4 MG/2ML IJ SOLN
INTRAMUSCULAR | Status: AC
  Filled 2023-09-20: qty 2

## 2023-09-20 MED ORDER — POTASSIUM CHLORIDE IN NACL 20-0.9 MEQ/L-% IV SOLN
INTRAVENOUS | Status: DC
  Filled 2023-09-20: qty 1000

## 2023-09-20 MED ORDER — NITROGLYCERIN 0.4 MG SL SUBL: 0.4000 mg | SUBLINGUAL_TABLET | SUBLINGUAL | Status: DC | PRN

## 2023-09-20 MED ORDER — LORATADINE 10 MG PO TABS
10.0000 mg | ORAL_TABLET | Freq: Every day | ORAL | Status: DC
  Administered 2023-09-20 – 2023-09-21 (×2): 10 mg via ORAL
  Filled 2023-09-20 (×2): qty 1

## 2023-09-20 MED ORDER — PROPOFOL 10 MG/ML IV BOLUS
INTRAVENOUS | Status: AC
  Filled 2023-09-20: qty 20

## 2023-09-20 MED ORDER — ROCURONIUM BROMIDE 10 MG/ML (PF) SYRINGE
PREFILLED_SYRINGE | INTRAVENOUS | Status: DC | PRN
  Administered 2023-09-20: 20 mg via INTRAVENOUS
  Administered 2023-09-20: 60 mg via INTRAVENOUS
  Administered 2023-09-20 (×2): 20 mg via INTRAVENOUS

## 2023-09-20 MED ORDER — PHENYLEPHRINE HCL-NACL 20-0.9 MG/250ML-% IV SOLN
INTRAVENOUS | Status: DC | PRN
  Administered 2023-09-20: 20 ug/min via INTRAVENOUS

## 2023-09-20 MED ORDER — PROPOFOL 10 MG/ML IV BOLUS
INTRAVENOUS | Status: DC | PRN
  Administered 2023-09-20: 130 mg via INTRAVENOUS

## 2023-09-20 MED ORDER — SODIUM CHLORIDE 0.9 % IV SOLN: INTRAVENOUS | Status: DC | PRN

## 2023-09-20 MED ORDER — PHENYLEPHRINE 80 MCG/ML (10ML) SYRINGE FOR IV PUSH (FOR BLOOD PRESSURE SUPPORT)
PREFILLED_SYRINGE | INTRAVENOUS | Status: AC
  Filled 2023-09-20: qty 10

## 2023-09-20 MED ORDER — ONDANSETRON HCL 4 MG/2ML IJ SOLN
INTRAMUSCULAR | Status: DC | PRN
  Administered 2023-09-20: 4 mg via INTRAVENOUS

## 2023-09-20 MED ORDER — ROCURONIUM BROMIDE 10 MG/ML (PF) SYRINGE
PREFILLED_SYRINGE | INTRAVENOUS | Status: AC
  Filled 2023-09-20: qty 10

## 2023-09-20 MED ORDER — HYDROCHLOROTHIAZIDE 25 MG PO TABS
25.0000 mg | ORAL_TABLET | Freq: Every day | ORAL | Status: DC
  Administered 2023-09-20 – 2023-09-21 (×2): 25 mg via ORAL
  Filled 2023-09-20 (×2): qty 1

## 2023-09-20 MED ORDER — FENTANYL CITRATE (PF) 250 MCG/5ML IJ SOLN
INTRAMUSCULAR | Status: AC
  Filled 2023-09-20: qty 5

## 2023-09-20 MED ORDER — FENTANYL CITRATE (PF) 100 MCG/2ML IJ SOLN
INTRAMUSCULAR | Status: AC
  Filled 2023-09-20: qty 2

## 2023-09-20 MED ORDER — PHENYLEPHRINE 80 MCG/ML (10ML) SYRINGE FOR IV PUSH (FOR BLOOD PRESSURE SUPPORT)
PREFILLED_SYRINGE | INTRAVENOUS | Status: DC | PRN
  Administered 2023-09-20 (×2): 80 ug via INTRAVENOUS

## 2023-09-20 MED ORDER — OXYCODONE HCL 5 MG PO TABS: 5.0000 mg | ORAL_TABLET | Freq: Once | ORAL | Status: DC | PRN

## 2023-09-20 MED ORDER — ACETAMINOPHEN 10 MG/ML IV SOLN: 1000.0000 mg | Freq: Once | INTRAVENOUS | Status: DC | PRN

## 2023-09-20 MED ORDER — ROSUVASTATIN CALCIUM 20 MG PO TABS
40.0000 mg | ORAL_TABLET | Freq: Every day | ORAL | Status: DC
Start: 2023-09-20 — End: 2023-09-21
  Administered 2023-09-20 – 2023-09-21 (×2): 40 mg via ORAL
  Filled 2023-09-20 (×2): qty 2

## 2023-09-20 MED ORDER — OXYCODONE HCL 5 MG PO TABS
10.0000 mg | ORAL_TABLET | ORAL | Status: DC | PRN
  Administered 2023-09-20 – 2023-09-21 (×3): 10 mg via ORAL
  Filled 2023-09-20 (×2): qty 2

## 2023-09-20 MED ORDER — FENTANYL CITRATE (PF) 100 MCG/2ML IJ SOLN
25.0000 ug | INTRAMUSCULAR | Status: DC | PRN
  Administered 2023-09-20 (×2): 50 ug via INTRAVENOUS

## 2023-09-20 MED ORDER — ONDANSETRON HCL 4 MG/2ML IJ SOLN: 4.0000 mg | Freq: Four times a day (QID) | INTRAMUSCULAR | Status: DC | PRN

## 2023-09-20 MED ORDER — PHENOL 1.4 % MT LIQD: 1.0000 | OROMUCOSAL | Status: DC | PRN

## 2023-09-20 MED ORDER — LOSARTAN POTASSIUM 50 MG PO TABS
100.0000 mg | ORAL_TABLET | Freq: Every day | ORAL | Status: DC
  Administered 2023-09-20 – 2023-09-21 (×2): 100 mg via ORAL
  Filled 2023-09-20 (×2): qty 2

## 2023-09-20 MED ORDER — OXYCODONE HCL 5 MG PO TABS: 5.0000 mg | ORAL_TABLET | ORAL | Status: DC | PRN

## 2023-09-20 MED ORDER — SODIUM CHLORIDE 0.9% FLUSH
3.0000 mL | Freq: Two times a day (BID) | INTRAVENOUS | Status: DC
  Administered 2023-09-20 – 2023-09-21 (×2): 3 mL via INTRAVENOUS

## 2023-09-20 MED ORDER — CHLORHEXIDINE GLUCONATE 0.12 % MT SOLN
15.0000 mL | Freq: Once | OROMUCOSAL | Status: AC
  Administered 2023-09-20: 15 mL via OROMUCOSAL
  Filled 2023-09-20: qty 15

## 2023-09-20 MED ORDER — OXYCODONE HCL 5 MG PO TABS
ORAL_TABLET | ORAL | Status: AC
  Filled 2023-09-20: qty 2

## 2023-09-20 MED ORDER — SUGAMMADEX SODIUM 200 MG/2ML IV SOLN
INTRAVENOUS | Status: DC | PRN
  Administered 2023-09-20: 350 mg via INTRAVENOUS

## 2023-09-20 MED ORDER — LIDOCAINE 2% (20 MG/ML) 5 ML SYRINGE
INTRAMUSCULAR | Status: AC
Start: 2023-09-20 — End: 2023-09-20
  Filled 2023-09-20: qty 5

## 2023-09-20 MED ORDER — LIDOCAINE-EPINEPHRINE 0.5 %-1:200000 IJ SOLN
INTRAMUSCULAR | Status: DC | PRN
  Administered 2023-09-20: 5 mL

## 2023-09-20 MED ORDER — ACETAMINOPHEN 650 MG RE SUPP: 650.0000 mg | RECTAL | Status: DC | PRN

## 2023-09-20 MED ORDER — 0.9 % SODIUM CHLORIDE (POUR BTL) OPTIME
TOPICAL | Status: DC | PRN
  Administered 2023-09-20: 1000 mL

## 2023-09-20 MED ORDER — OXYCODONE HCL 5 MG/5ML PO SOLN: 5.0000 mg | Freq: Once | ORAL | Status: DC | PRN

## 2023-09-20 MED ORDER — ACETAMINOPHEN 325 MG PO TABS: 650.0000 mg | ORAL_TABLET | ORAL | Status: DC | PRN

## 2023-09-20 MED ORDER — LACTATED RINGERS IV SOLN: INTRAVENOUS | Status: DC

## 2023-09-20 MED ORDER — ONDANSETRON HCL 4 MG/2ML IJ SOLN: 4.0000 mg | Freq: Once | INTRAMUSCULAR | Status: DC | PRN

## 2023-09-20 MED ORDER — ACETAMINOPHEN 500 MG PO TABS
1000.0000 mg | ORAL_TABLET | Freq: Four times a day (QID) | ORAL | Status: DC
  Administered 2023-09-20 – 2023-09-21 (×2): 1000 mg via ORAL
  Filled 2023-09-20 (×2): qty 2

## 2023-09-20 MED ORDER — MIDAZOLAM HCL 2 MG/2ML IJ SOLN
INTRAMUSCULAR | Status: AC
  Filled 2023-09-20: qty 2

## 2023-09-20 MED ORDER — SODIUM CHLORIDE 0.9% FLUSH: 3.0000 mL | INTRAVENOUS | Status: DC | PRN

## 2023-09-20 MED ORDER — MIDAZOLAM HCL 2 MG/2ML IJ SOLN
INTRAMUSCULAR | Status: DC | PRN
  Administered 2023-09-20: 2 mg via INTRAVENOUS

## 2023-09-20 MED ORDER — DEXAMETHASONE SODIUM PHOSPHATE 10 MG/ML IJ SOLN
INTRAMUSCULAR | Status: DC | PRN
  Administered 2023-09-20: 10 mg via INTRAVENOUS

## 2023-09-20 MED ORDER — CEFAZOLIN SODIUM-DEXTROSE 1-4 GM/50ML-% IV SOLN
1.0000 g | Freq: Three times a day (TID) | INTRAVENOUS | Status: AC
  Administered 2023-09-20 – 2023-09-21 (×2): 1 g via INTRAVENOUS
  Filled 2023-09-20 (×2): qty 50

## 2023-09-20 MED ORDER — ORAL CARE MOUTH RINSE: 15.0000 mL | Freq: Once | OROMUCOSAL | Status: AC

## 2023-09-20 MED ORDER — ASPIRIN 81 MG PO TBEC
81.0000 mg | DELAYED_RELEASE_TABLET | Freq: Every day | ORAL | Status: DC
Start: 2023-09-20 — End: 2023-09-21
  Administered 2023-09-20 – 2023-09-21 (×2): 81 mg via ORAL
  Filled 2023-09-20 (×2): qty 1

## 2023-09-20 MED ORDER — FENTANYL CITRATE (PF) 250 MCG/5ML IJ SOLN
INTRAMUSCULAR | Status: DC | PRN
  Administered 2023-09-20: 100 ug via INTRAVENOUS
  Administered 2023-09-20 (×3): 50 ug via INTRAVENOUS

## 2023-09-20 MED ORDER — ONDANSETRON HCL 4 MG PO TABS: 4.0000 mg | ORAL_TABLET | Freq: Four times a day (QID) | ORAL | Status: DC | PRN

## 2023-09-20 MED ORDER — MENTHOL 3 MG MT LOZG: 1.0000 | LOZENGE | OROMUCOSAL | Status: DC | PRN

## 2023-09-20 MED ORDER — SODIUM CHLORIDE 0.9 % IV SOLN
250.0000 mL | INTRAVENOUS | Status: DC
  Administered 2023-09-20: 250 mL via INTRAVENOUS

## 2023-09-20 MED ORDER — THROMBIN 20000 UNITS EX SOLR
CUTANEOUS | Status: AC
Start: 2023-09-20 — End: 2023-09-20
  Filled 2023-09-20: qty 20000

## 2023-09-20 MED ORDER — LIDOCAINE-EPINEPHRINE 0.5 %-1:200000 IJ SOLN
INTRAMUSCULAR | Status: AC
  Filled 2023-09-20: qty 50

## 2023-09-20 MED ORDER — CEFAZOLIN SODIUM-DEXTROSE 2-4 GM/100ML-% IV SOLN
2.0000 g | INTRAVENOUS | Status: AC
  Administered 2023-09-20: 2 g via INTRAVENOUS
  Filled 2023-09-20: qty 100

## 2023-09-20 MED ORDER — CHLORHEXIDINE GLUCONATE CLOTH 2 % EX PADS
6.0000 | MEDICATED_PAD | Freq: Once | CUTANEOUS | Status: DC
Start: 2023-09-20 — End: 2023-09-20

## 2023-09-20 MED ORDER — DEXAMETHASONE SODIUM PHOSPHATE 10 MG/ML IJ SOLN
INTRAMUSCULAR | Status: AC
Start: 2023-09-20 — End: 2023-09-20
  Filled 2023-09-20: qty 1

## 2023-09-20 MED ORDER — LIDOCAINE 2% (20 MG/ML) 5 ML SYRINGE
INTRAMUSCULAR | Status: DC | PRN
  Administered 2023-09-20: 60 mg via INTRAVENOUS

## 2023-09-20 MED ORDER — CHLORHEXIDINE GLUCONATE CLOTH 2 % EX PADS: 6.0000 | MEDICATED_PAD | Freq: Once | CUTANEOUS | Status: DC

## 2023-09-20 MED ORDER — OXYCODONE HCL ER 10 MG PO T12A
10.0000 mg | EXTENDED_RELEASE_TABLET | Freq: Two times a day (BID) | ORAL | Status: DC
  Administered 2023-09-20 – 2023-09-21 (×2): 10 mg via ORAL
  Filled 2023-09-20 (×2): qty 1

## 2023-09-20 MED ORDER — DIAZEPAM 5 MG PO TABS: 5.0000 mg | ORAL_TABLET | Freq: Four times a day (QID) | ORAL | Status: DC | PRN

## 2023-09-20 MED ORDER — THROMBIN 20000 UNITS EX KIT
PACK | CUTANEOUS | Status: DC | PRN
  Administered 2023-09-20: 20 mL via TOPICAL

## 2023-09-20 SURGICAL SUPPLY — 45 items
BAG COUNTER SPONGE SURGICOUNT (BAG) ×1 IMPLANT
BAND RUBBER #18 3X1/16 STRL (MISCELLANEOUS) ×2 IMPLANT
BASKET BONE COLLECTION (BASKET) IMPLANT
BLADE CLIPPER SURG (BLADE) IMPLANT
BUR DRUM 4.0 (BURR) ×1 IMPLANT
BUR MATCHSTICK NEURO 3.0 LAGG (BURR) ×1 IMPLANT
CAGE MONOLITH CORE 12X12 (Cage) IMPLANT
CANISTER SUCTION 3000ML PPV (SUCTIONS) ×1 IMPLANT
CAP END MONOLITH PARA 14 RND (Cap) IMPLANT
DERMABOND ADVANCED .7 DNX12 (GAUZE/BANDAGES/DRESSINGS) ×1 IMPLANT
DRAPE HALF SHEET 40X57 (DRAPES) IMPLANT
DRAPE LAPAROTOMY 100X72 PEDS (DRAPES) ×1 IMPLANT
DRAPE MICROSCOPE SLANT 54X150 (MISCELLANEOUS) ×1 IMPLANT
DURAPREP 6ML APPLICATOR 50/CS (WOUND CARE) ×1 IMPLANT
ELECT COATED BLADE 2.86 ST (ELECTRODE) ×1 IMPLANT
ELECTRODE REM PT RTRN 9FT ADLT (ELECTROSURGICAL) ×1 IMPLANT
GAUZE 4X4 16PLY ~~LOC~~+RFID DBL (SPONGE) IMPLANT
GLOVE ECLIPSE 6.5 STRL STRAW (GLOVE) ×1 IMPLANT
GLOVE EXAM NITRILE XL STR (GLOVE) IMPLANT
GOWN STRL REUS W/ TWL LRG LVL3 (GOWN DISPOSABLE) ×2 IMPLANT
GOWN STRL REUS W/ TWL XL LVL3 (GOWN DISPOSABLE) IMPLANT
GOWN STRL REUS W/TWL 2XL LVL3 (GOWN DISPOSABLE) IMPLANT
HALTER HD/CHIN CERV TRACTION D (MISCELLANEOUS) IMPLANT
HEMOSTAT SURGICEL 2X14 (HEMOSTASIS) IMPLANT
KIT BASIN OR (CUSTOM PROCEDURE TRAY) ×1 IMPLANT
KIT TURNOVER KIT B (KITS) ×1 IMPLANT
NDL HYPO 25X1 1.5 SAFETY (NEEDLE) ×1 IMPLANT
NDL SPNL 22GX3.5 QUINCKE BK (NEEDLE) ×1 IMPLANT
NEEDLE HYPO 25X1 1.5 SAFETY (NEEDLE) ×1 IMPLANT
NEEDLE SPNL 22GX3.5 QUINCKE BK (NEEDLE) ×1 IMPLANT
NS IRRIG 1000ML POUR BTL (IV SOLUTION) ×1 IMPLANT
PACK LAMINECTOMY NEURO (CUSTOM PROCEDURE TRAY) ×1 IMPLANT
PAD ARMBOARD POSITIONER FOAM (MISCELLANEOUS) ×1 IMPLANT
PIN DISTRACTION 14MM (PIN) IMPLANT
PLATE ACP 1.6X36 2LVL (Plate) IMPLANT
SCREW ACP VA SD 3.5X15 (Screw) IMPLANT
SCREW ACP VA ST 3.5X15 (Screw) IMPLANT
SPIKE FLUID TRANSFER (MISCELLANEOUS) ×1 IMPLANT
SPONGE INTESTINAL PEANUT (DISPOSABLE) ×1 IMPLANT
SPONGE SURGIFOAM ABS GEL 100 (HEMOSTASIS) IMPLANT
SUT VIC AB 0 CT1 27XBRD ANTBC (SUTURE) IMPLANT
SUT VIC AB 3-0 SH 8-18 (SUTURE) ×1 IMPLANT
TOWEL GREEN STERILE (TOWEL DISPOSABLE) ×1 IMPLANT
TOWEL GREEN STERILE FF (TOWEL DISPOSABLE) ×1 IMPLANT
WATER STERILE IRR 1000ML POUR (IV SOLUTION) ×1 IMPLANT

## 2023-09-20 NOTE — Anesthesia Procedure Notes (Signed)
 Procedure Name: Intubation Date/Time: 09/20/2023 11:30 AM  Performed by: Jadarious Dobbins C, CRNAPre-anesthesia Checklist: Patient identified, Emergency Drugs available, Suction available and Patient being monitored Patient Re-evaluated:Patient Re-evaluated prior to induction Oxygen Delivery Method: Circle system utilized Preoxygenation: Pre-oxygenation with 100% oxygen Induction Type: IV induction Ventilation: Mask ventilation without difficulty Laryngoscope Size: Glidescope and 3 Grade View: Grade I Tube type: Oral Number of attempts: 1 Airway Equipment and Method: Oral airway and Rigid stylet Placement Confirmation: ETT inserted through vocal cords under direct vision, positive ETCO2 and breath sounds checked- equal and bilateral Secured at: 22 cm Tube secured with: Tape Dental Injury: Teeth and Oropharynx as per pre-operative assessment

## 2023-09-20 NOTE — Transfer of Care (Signed)
 Immediate Anesthesia Transfer of Care Note  Patient: Jesse Sanders  Procedure(s) Performed: ANTERIOR CERVICAL CORPECTOMY CERVICAL FOUR,CERVICAL THREE-FIVE ARTHODESIS  Patient Location: PACU  Anesthesia Type:General  Level of Consciousness: awake, drowsy, and patient cooperative  Airway & Oxygen Therapy: Patient Spontanous Breathing and Patient connected to face mask oxygen  Post-op Assessment: Report given to RN and Post -op Vital signs reviewed and stable  Post vital signs: Reviewed and stable  Last Vitals:  Vitals Value Taken Time  BP 148/86 09/20/23 15:21  Temp    Pulse 78 09/20/23 15:26  Resp 17 09/20/23 15:26  SpO2 93 % 09/20/23 15:26  Vitals shown include unfiled device data.  Last Pain:  Vitals:   09/20/23 0832  TempSrc:   PainSc: 8       Patients Stated Pain Goal: 2 (09/20/23 9167)  Complications: No notable events documented.

## 2023-09-20 NOTE — H&P (Signed)
 BP (!) 167/102 (BP Location: Right Arm) Comment: RN Notified  Pulse 69   Temp 98 F (36.7 C) (Oral)   Resp 19   Ht 5' 11 (1.803 m)   Wt 85.7 kg   SpO2 99%   BMI 26.36 kg/m   Mr. Vazguez comes in today for evaluation of pain that has in his neck and upper extremities.  He says the pain is 10/10.  Feels that the pain in the upper extremity is worse than the pain in his neck.  He could sit for 10, stand for 10 and walk for 10 minutes without pain.  He says the pain is aching, burning, deep, electrical and shooting.  Says this is being caused by his neck and arms.  Bowel and bladder function is normal.     Past medical history includes hypertension, hyperlipidemia, and lung disease.     He lives with his spouse.  He does smoke and has smoked for at least 10 years.  He no longer uses alcohol.     He has been taking nonsteroidal Anti-inflammatory medications.     He has had a home exercise program and has been under the care of his physician.  He does have some ear pain, a cough with wheezing, chest pain, abdominal pain, some flank pain, neck pain, and joint pain.  Pain is in his right upper extremity.  He had a previous MRI in 2023, which showed stenosis C3-C7.  On his referral, it states that he had normal strength 5/5.  Also states he was taking Gabapentin  300 mg.  He also takes Losartan /Hydrochlorothiazide .  He had no neurological issues on that exam.  Possible some change in the cord signal right behind C4-5, but that is only a hint of on T2 and looking at the  image I cannot say that I see it.   On examination, he is alert, oriented by 4.  He answers all questions appropriately.  Memory, language, attention span, and fund of knowledge are normal.  Speech is clear, it is also fluent.  Hearing intact to voice.  Uvula elevates in midline.  Shoulder shrug is normal.  Tongue protrudes in the midline.  He has intact proprioception in the upper extremities.  Romberg is negative.  Gait is normal.  He  has 5/5 strength in the deltoids, biceps, triceps, grip and intrinsics.  2+ reflexes biceps, triceps, brachioradialis, knees, and ankles.  Mr. Schwanke returns with an MRI of the cervical spine.  What he has is a good deal of stenosis and cord signal behind at C4-5 and he is stenotic at the C3-4 level down to the 4-5 level.  He at this point states that he is in enough discomfort and has had to change how he lives secondary to the pain that he is experiencing.  I have therefore offered him, and he has agreed to undergo a C4 corpectomy as I do not believe discectomies would be sufficient to decompress the canal.  I will place a strut and a plate from 3-5.

## 2023-09-20 NOTE — Op Note (Signed)
 09/20/2023  3:38 PM  PATIENT:  Jesse Sanders  58 y.o. male With cervical stenosis and myelopathy admitted for operative decompression and athrodesis C3-5.  PRE-OPERATIVE DIAGNOSIS:  Cervical stenosis of spinal canal C3-5  POST-OPERATIVE DIAGNOSIS:  Cervical stenosis of spinal canal C3-5  PROCEDURE:  Procedure(s): ANTERIOR CERVICAL CORPECTOMY CERVICAL FOUR,CERVICAL THREE-FIVE ARTHODESIS with interbody peek cage filled with autograft morsels Nuvasive Microdissection Anterior plating C3-5 Nuvasive SURGEON: Surgeon(s): Gillie Duncans, MD  ASSISTANTS:none  ANESTHESIA:   general  EBL:  Total I/O In: 1300 [I.V.:1300] Out: 100 [Blood:100]  BLOOD ADMINISTERED:none  CELL SAVER GIVEN:not used  COUNT:per nursing  DRAINS: none   SPECIMEN:  No Specimen  DICTATION: Jesse Sanders was taken to the operating room, intubated, and placed under a general anesthetic without difficulty. He was positioned supine with his head on a horseshoe. His neck was prepped and draped in a sterile manner.  I infiltrated the planned incision with 5cc lidocaine . I opened the incision starting from the midline to the medial border of the sternocleidomastoid muscle with a 10 blade. I used scissors to dissect the subcutaneous tissue. I created an avascular passage to the anterior vertebral space. I kept the carotid sheath laterally and the strap muscles medially. I placed a needle for localization and based on that exposed C3/4, and C4/5. I opened the disc spaces at each level with a 15 blade. I removed more disc with curettes and rongeurs.  I used the drill to open the disc spaces further allowing for a full decompression and corpectomy. I used the drill to remove much of the C4 body. I did this with microdissection. I used the Kerrison punches, and various tools to separate the bone from the posterior longitudinal ligament. I removed the posterior longitudinal ligament with the punches. I decompress the spinal canal  from C3-5, and decompressed the C4, and C5 roots. I took down the uncovertebral joints at C3/4 and 4/5 bilaterally.  I measured the intervertebral space after the corpectomy, and fashioned a peek implant filled with autograft morsels. I placed the cage between the C3 and C5 bodies to complete the arthrodesis.  I placed the anterior plate, using two screws at C3 and at C5. Xray showed the implant, screws and plates in correct position.  I irrigated and achieved hemostasis.  I closed the wound approximating the platysma, then the subcuticular plane with sutures. I applied a sterile dressing. He was extubated in the operating room and taken to the PACU.  PLAN OF CARE: Admit for overnight observation  PATIENT DISPOSITION:  PACU - hemodynamically stable.   Delay start of Pharmacological VTE agent (>24hrs) due to surgical blood loss or risk of bleeding:  yes

## 2023-09-21 DIAGNOSIS — M4802 Spinal stenosis, cervical region: Secondary | ICD-10-CM | POA: Diagnosis not present

## 2023-09-21 MED ORDER — OXYCODONE HCL 5 MG PO TABS: 5.0000 mg | ORAL_TABLET | ORAL | 0 refills | Status: AC | PRN

## 2023-09-21 MED ORDER — DEXAMETHASONE 4 MG PO TABS: 4.0000 mg | ORAL_TABLET | Freq: Two times a day (BID) | ORAL | 0 refills | Status: AC

## 2023-09-21 NOTE — Discharge Summary (Signed)
 BP (!) 151/86 (BP Location: Left Arm)   Pulse 75   Temp 98.1 F (36.7 C) (Oral)   Resp 18   Ht 5' 11 (1.803 m)   Wt 85.7 kg   SpO2 98%   BMI 26.36 kg/m  Physician Discharge Summary  Patient ID: Jesse Sanders MRN: [id number] DOB/AGE: February 23, 1965 58 y.o.  Admit date: 09/20/2023 Discharge date: 09/21/2023  Admission Diagnoses:Cervical stenosis with myelopathy C3-5  Discharge Diagnoses: same Principal Problem:   Cervical cord compression with myelopathy [facility])   Discharged Condition: good  Hospital Course: Jesse Sanders was admitted and taken to the operating room for an uncomplicated C4 corpectomy and arthrodesis C3-5 with nuvasive peek corpectomy cage with autograft. Post op he is tolerating a regular diet, ambulating, and voiding. His wound is clean, dry, and without signs of infection. His voice is hoarse, incision flat. He will be discharged to home.   Treatments: surgery: as above  Discharge Exam: Blood pressure (!) 151/86, pulse 75, temperature 98.1 F (36.7 C), temperature source Oral, resp. rate 18, height 5' 11 (1.803 m), weight 85.7 kg, SpO2 98%. General appearance: alert, cooperative, and no distress  Disposition: Discharge disposition: 01-Home or Self Care      Cervical stenosis of spinal canal  Allergies as of 09/21/2023       Reactions   Aleve [naproxen Sodium] Other (See Comments)   Facial swelling        Medication List     TAKE these medications    albuterol  108 (90 Base) MCG/ACT inhaler Commonly known as: VENTOLIN  HFA INHALE 1-2 PUFFS BY MOUTH EVERY 6 HOURS AS NEEDED FOR WHEEZE OR SHORTNESS OF BREATH   aspirin  EC 81 MG tablet Take 1 tablet (81 mg total) by mouth daily.   cetirizine  10 MG tablet Commonly known as: ZYRTEC  Take 1 tablet (10 mg total) by mouth daily. What changed:  when to take this reasons to take this   cyclobenzaprine  10 MG tablet Commonly known as: FLEXERIL  Take 1 tablet (10 mg total) by mouth daily as needed for  muscle spasms.   dexamethasone  4 MG tablet Commonly known as: DECADRON  Take 1 tablet (4 mg total) by mouth 2 (two) times daily with a meal.   gabapentin  300 MG capsule Commonly known as: NEURONTIN  Take 1 capsule by mouth at bedtime   hydrochlorothiazide  25 MG tablet Commonly known as: HYDRODIURIL  Take 1 tablet by mouth once daily   losartan  100 MG tablet Commonly known as: COZAAR  Take 1 tablet by mouth once daily   nitroGLYCERIN  0.4 MG SL tablet Commonly known as: NITROSTAT  Place 1 tablet (0.4 mg total) under the tongue every 5 (five) minutes as needed for chest pain. Place 1 tablet (0.4mg ) under the tongue every 5 minutes as needed for chest pain. If you require more than 2 doses, seen urgent evaluation.   oxyCODONE  5 MG immediate release tablet Commonly known as: Oxy IR/ROXICODONE  Take 1 tablet (5 mg total) by mouth every 3 (three) hours as needed for moderate pain (pain score 4-6).   predniSONE  20 MG tablet Commonly known as: DELTASONE  Take 2 tablets (40 mg total) by mouth daily with breakfast.   rosuvastatin  40 MG tablet Commonly known as: CRESTOR  Take 1 tablet (40 mg total) by mouth daily.         SignedBETHA Rockey [place] 09/21/2023, 9:58 AM

## 2023-09-21 NOTE — Progress Notes (Signed)
 OT Screen Note  Patient Details Name: Jesse Sanders MRN: [id number] DOB: 11/22/1965   Cancelled Treatment:    Reason Eval/Treat Not Completed: OT screened, no needs identified, will sign off (Discussed with providing PT, pt doing well functionally with Cspine precautions and no challenges with mobility/ADLs. OT signing off, thank you for this consult)  09/21/2023  AB, OTR/L  Acute Rehabilitation Services  Office: [phone]   Curtistine JONETTA Das 09/21/2023, 9:29 AM

## 2023-09-21 NOTE — Evaluation (Signed)
 Physical Therapy Evaluation and Discharge Patient Details Name: Jesse Sanders MRN: [id number] DOB: 11/19/1965 Today's Date: 09/21/2023  History of Present Illness  Pt is 58 yo male who presents on 09/20/23 with pain in neck and UE's. Pt underwent C4 corpectomy with placement of strut and plate R6-4. PMH: HTN, HLD, lung disease, smoker, C3-7 stenosis  Clinical Impression  Patient evaluated by Physical Therapy with no further acute PT needs identified. All education has been completed and the patient has no further questions. Pt independent and working. Does not have pain in UE's as he did before and is able to reach behind back which he was unable to do either. Pt mobilizing safely without AD. Precautions, activity modifications, and posture reviewed and pt verbalizes understanding.  PT is signing off. Thank you for this referral.         If plan is discharge home, recommend the following: Assist for transportation   Can travel by private vehicle        Equipment Recommendations None recommended by PT  Recommendations for Other Services       Functional Status Assessment Patient has not had a recent decline in their functional status     Precautions / Restrictions Precautions Precautions: Cervical Precaution Booklet Issued: Yes (comment) Recall of Precautions/Restrictions: Intact Restrictions Weight Bearing Restrictions Per Provider Order: No      Mobility  Bed Mobility Overal bed mobility: Independent                  Transfers Overall transfer level: Independent Equipment used: None                    Ambulation/Gait Ambulation/Gait assistance: Independent Gait Distance (Feet): 200 Feet Assistive device: None Gait Pattern/deviations: WFL(Within Functional Limits) Gait velocity: WFL Gait velocity interpretation: >4.37 ft/sec, indicative of normal walking speed   General Gait Details: stable with ambulation  Stairs Stairs: Yes Stairs assistance:  Independent Stair Management: Forwards, One rail Left, Alternating pattern Number of Stairs: 6    Wheelchair Mobility     Tilt Bed    Modified Rankin (Stroke Patients Only)       Balance Overall balance assessment: Independent                                           Pertinent Vitals/Pain Pain Assessment Pain Assessment: Faces Faces Pain Scale: Hurts a little bit Pain Location: neck Pain Descriptors / Indicators: Operative site guarding Pain Intervention(s): Limited activity within patient's tolerance, Monitored during session    Home Living Family/patient expects to be discharged to:: Private residence Living Arrangements: Spouse/significant other Available Help at Discharge: Family;Available PRN/intermittently Type of Home: House Home Access: Level entry       Home Layout: One level Home Equipment: None Additional Comments: wife will drive him for while and he will take 4-6 wks off work    Prior Function Prior Level of Function : Independent/Modified Independent;Working/employed;Driving             Mobility Comments: independent, works with adults with autism ADLs Comments: independent     Extremity/Trunk Assessment   Upper Extremity Assessment Upper Extremity Assessment: Overall WFL for tasks assessed    Lower Extremity Assessment Lower Extremity Assessment: Overall WFL for tasks assessed    Cervical / Trunk Assessment Cervical / Trunk Assessment: Normal  Communication   Communication Communication: No apparent difficulties  Cognition Arousal: Alert Behavior During Therapy: WFL for tasks assessed/performed   PT - Cognitive impairments: No apparent impairments                         Following commands: Intact       Cueing Cueing Techniques: Verbal cues     General Comments General comments (skin integrity, edema, etc.): reviewed precautions, activity modification, and posture.    Exercises      Assessment/Plan    PT Assessment Patient does not need any further PT services  PT Problem List         PT Treatment Interventions      PT Goals (Current goals can be found in the Care Plan section)  Acute Rehab PT Goals Patient Stated Goal: return home PT Goal Formulation: All assessment and education complete, DC therapy    Frequency       Co-evaluation               AM-PAC PT 6 Clicks Mobility  Outcome Measure Help needed turning from your back to your side while in a flat bed without using bedrails?: None Help needed moving from lying on your back to sitting on the side of a flat bed without using bedrails?: None Help needed moving to and from a bed to a chair (including a wheelchair)?: None Help needed standing up from a chair using your arms (e.g., wheelchair or bedside chair)?: None Help needed to walk in hospital room?: None Help needed climbing 3-5 steps with a railing? : None 6 Click Score: 24    End of Session   Activity Tolerance: Patient tolerated treatment well Patient left: in chair;with call bell/phone within reach Nurse Communication: Mobility status PT Visit Diagnosis: Pain Pain - part of body:  (neck)    Time: 9099-9079 PT Time Calculation (min) (ACUTE ONLY): 20 min   Charges:   PT Evaluation $PT Eval Low Complexity: 1 Low   PT General Charges $$ ACUTE PT VISIT: 1 Visit         Richerd Lipoma, PT  Acute Rehab Services Secure chat preferred Office [phone]   [place] L Rainn Bullinger 09/21/2023, 10:14 AM

## 2023-09-21 NOTE — Anesthesia Postprocedure Evaluation (Signed)
 Anesthesia Post Note  Patient: Jesse Sanders  Procedure(s) Performed: ANTERIOR CERVICAL CORPECTOMY CERVICAL FOUR,CERVICAL THREE-FIVE ARTHODESIS     Patient location during evaluation: PACU Anesthesia Type: General Level of consciousness: awake and alert Pain management: pain level controlled Vital Signs Assessment: post-procedure vital signs reviewed and stable Respiratory status: spontaneous breathing, nonlabored ventilation, respiratory function stable and patient connected to nasal cannula oxygen Cardiovascular status: blood pressure returned to baseline and stable Postop Assessment: no apparent nausea or vomiting Anesthetic complications: no   No notable events documented.          Lynwood MARLA Cornea

## 2023-09-21 NOTE — Discharge Instructions (Signed)
Anterior Cervical Fusion Care After Pinching of the nerves is a common cause of long-term pain. When this happens, a procedure called an anterior cervical fusion is sometimes performed. It relieves the pressure on the pinched nerve roots or spinal cord in the neck. An anterior cervical fusion means that the operation is done through the front (anterior) of your neck to fuse bones in your neck together. This procedure is done to relieve the pressure on pinched nerve roots or spinal cord. This operation is done to control the movement of your spine, which may be pressing on the nerves. This may relieve the pain. The procedure that stops the movement of the spine is called a fusion. The cut by the surgeon (incision) is usually within a skin fold line under your chin. After moving the neck muscles gently apart, the neurosurgeon uses an operating microscope and removes the injured intervertebral disk (the cushion or pad of tissue between the bones of the spine). This takes the pressure off the nerves or spinal cord. This is called decompression. The area where the disc was removed is then filled with a bone graft. The graft will fuse the vertebrae together over time. This means it causes the vertebral bodies to grow together. The bone graft may be obtained from your own bone (your hip for example), or may be obtained from a bone bank. Receiving bone from a bone bank is similar to a blood bank, only the bone comes from human donors who have recently died. This type of graft is referred to as allograft bone. The preformed bone plug is safe and will not be rejected by your body. It does not contain blood cells. In some cases, the surgeon may use hardware in your neck to help stabilize it. This means that metal plates or pins or screws may be used to:  Provide extra support to the neck.   Help the bones to grow together more easily.  A cervical fusion procedure takes a couple hours to several hours, depending on  what needs to be done. Your caregiver will be able to answer your questions for you. HOME CARE INSTRUCTIONS   It will be normal to have a sore throat and have difficulty swallowing foods for a couple weeks following surgery. See your caregiver if this seems to be getting worse rather than better.   You may resume normal diet and activities as directed or allowed. Generally, walking and stair climbing are fine. Avoid lifting more than ten pounds and do no lifting above your head.   If given a cervical collar, remove only for bathing and eating, or as directed.   Use only showers for cleaning up, with no bathing, until seen.   You may apply ice to the surgical or bone donor site for 15 to 20 minutes each hour while awake for the first couple days following surgery. Put the ice in a plastic bag and place a towel between the bag of ice and your skin.   Change dressings if necessary or as directed.   You may drive in 10 days   Take prescribed medication as directed. Only take over-the-counter or prescription medicines for pain, discomfort, or fever as directed by your caregiver.   Make an appointment to see your caregiver for suture or staple removal when instructed.   If physical therapy was prescribed, follow your caregiver's directions.  SEEK IMMEDIATE MEDICAL CARE IF:  There is redness, swelling, or increasing pain in the wound.   There is  pus coming from the wound.   An unexplained oral temperature over 102 F (38.9 C) develops.   There is a bad smell coming from the wound or dressing.   You have swelling in your calf or leg.   You develop shortness of breath or chest pain.   The wound edges break open after sutures or staples have been removed.   Your pain is not controlled with medicine.   You seem to be getting worse rather than better.  Document Released: 08/09/2003 Document Revised: 09/06/2010 Document Reviewed: 10/15/2007 [facility] Patient Information 2012 [place].

## 2023-09-23 ENCOUNTER — Encounter (HOSPITAL_COMMUNITY): Payer: Self-pay | Admitting: Neurosurgery

## 2023-09-23 MED FILL — Thrombin For Soln 20000 Unit: CUTANEOUS | Qty: 1 | Status: AC

## 2023-12-03 ENCOUNTER — Ambulatory Visit

## 2023-12-10 ENCOUNTER — Other Ambulatory Visit: Payer: Self-pay | Admitting: Student

## 2023-12-10 DIAGNOSIS — I10 Essential (primary) hypertension: Secondary | ICD-10-CM

## 2023-12-11 NOTE — Telephone Encounter (Signed)
 Medication sent to pharmacy

## 2023-12-20 ENCOUNTER — Encounter: Payer: Self-pay | Admitting: *Deleted

## 2023-12-23 ENCOUNTER — Other Ambulatory Visit: Payer: Self-pay | Admitting: Medical Genetics

## 2023-12-23 ENCOUNTER — Other Ambulatory Visit: Payer: Self-pay

## 2023-12-23 ENCOUNTER — Ambulatory Visit: Admitting: Student

## 2023-12-23 ENCOUNTER — Encounter: Payer: Self-pay | Admitting: Student

## 2023-12-23 VITALS — BP 156/100 | HR 96 | Temp 98.0°F | Ht 71.0 in | Wt 191.2 lb

## 2023-12-23 DIAGNOSIS — Z23 Encounter for immunization: Secondary | ICD-10-CM | POA: Diagnosis not present

## 2023-12-23 DIAGNOSIS — Z209 Contact with and (suspected) exposure to unspecified communicable disease: Secondary | ICD-10-CM

## 2023-12-23 DIAGNOSIS — I10 Essential (primary) hypertension: Secondary | ICD-10-CM

## 2023-12-23 DIAGNOSIS — Z7251 High risk heterosexual behavior: Secondary | ICD-10-CM

## 2023-12-23 MED ORDER — AMLODIPINE BESYLATE 5 MG PO TABS: 5.0000 mg | ORAL_TABLET | Freq: Every day | ORAL | 2 refills | Status: AC

## 2023-12-23 NOTE — Assessment & Plan Note (Signed)
 Blood pressure today elevated at 156/100.  This is consistent with his home blood pressure readings which are primarily around 150/80.  He is prescribed losartan  and hydrochlorothiazide  but ran out of his medications for a few months this year.  He resumed taking them about 2 weeks ago without issue but still has elevated blood pressure.  Will start amlodipine  and hopefully be able to change to a combination pill in the near future. - Start amlodipine  5 mg daily and continue losartan  100 mg daily and hydrochlorothiazide  25 mg daily - BMP today - Blood pressure log with goal 130/80

## 2023-12-23 NOTE — Progress Notes (Signed)
 CC: Routine Follow Up for management of chronic medical conditions after last office visit 03/04/2023  HPI:  Jesse Sanders is a 58 y.o. male with pertinent PMH of HTN, OSA, HLD, stable angina, cervical stenosis, and TUD who presents as above. Please see assessment and plan below for further details.  Medications: Current Outpatient Medications  Medication Instructions   albuterol  (VENTOLIN  HFA) 108 (90 Base) MCG/ACT inhaler INHALE 1-2 PUFFS BY MOUTH EVERY 6 HOURS AS NEEDED FOR WHEEZE OR SHORTNESS OF BREATH   amLODipine  (NORVASC ) 5 mg, Oral, Daily   aspirin  EC 81 mg, Oral, Daily   cetirizine  (ZYRTEC ) 10 mg, Oral, Daily   cyclobenzaprine  (FLEXERIL ) 10 mg, Oral, Daily PRN   dexamethasone  (DECADRON ) 4 mg, Oral, 2 times daily with meals   gabapentin  (NEURONTIN ) 300 mg, Oral, Daily at bedtime   hydrochlorothiazide  (HYDRODIURIL ) 25 mg, Oral, Daily   losartan  (COZAAR ) 100 mg, Oral, Daily   nitroGLYCERIN  (NITROSTAT ) 0.4 mg, Sublingual, Every 5 min PRN, Place 1 tablet (0.4mg ) under the tongue every 5 minutes as needed for chest pain. If you require more than 2 doses, seen urgent evaluation.   oxyCODONE  (OXY IR/ROXICODONE ) 5 mg, Oral, Every  3 hours PRN   predniSONE  (DELTASONE ) 40 mg, Oral, Daily with breakfast   rosuvastatin  (CRESTOR ) 40 mg, Oral, Daily     Review of Systems:   Pertinent items noted in HPI and/or A&P.  Physical Exam:  Vitals:   12/23/23 1013 12/23/23 1016  BP: (!) 157/102 (!) 156/100  Pulse: 93 96  Temp: 98 F (36.7 C)   TempSrc: Oral   SpO2: 99%   Weight: 191 lb 3.2 oz (86.7 kg)   Height: 5' 11 (1.803 m)     Constitutional: Well-appearing adult male. In no acute distress. HEENT: Normocephalic, atraumatic, Sclera non-icteric, PERRL, EOM intact Cardio:Regular rate and rhythm. 2+ bilateral radial pulses. Pulm:Clear to auscultation bilaterally. Normal work of breathing on room air. FDX:Wzhjupcz for extremity edema. Skin:Warm and dry. Neuro:Alert and oriented  x3. No focal deficit noted. Psych:Pleasant mood and affect.   Assessment & Plan:   Assessment & Plan Essential hypertension Blood pressure today elevated at 156/100.  This is consistent with his home blood pressure readings which are primarily around 150/80.  He is prescribed losartan  and hydrochlorothiazide  but ran out of his medications for a few months this year.  He resumed taking them about 2 weeks ago without issue but still has elevated blood pressure.  Will start amlodipine  and hopefully be able to change to a combination pill in the near future. - Start amlodipine  5 mg daily and continue losartan  100 mg daily and hydrochlorothiazide  25 mg daily - BMP today - Blood pressure log with goal 130/80 Sexual behavior with high risk of exposure to communicable disease Patient reports a possible exposure to herpes.  His last sexual encounter with this person was over a year ago but he was recently told about the exposure.  No reported genital or oral sores. - HSV 1 and 2 IgG antibody today Encounter for immunization   Orders Placed This Encounter  Procedures   Flu vaccine trivalent PF, 6mos and older(Flulaval,Afluria,Fluarix,Fluzone)   Pneumococcal conjugate vaccine 20-valent   Basic metabolic panel with GFR   HSV 1 and 2 Ab, IgG     Return in about 3 months (around 03/22/2024) for HTN.   Patient discussed with Dr. Mliss Foot  Fairy Pool, DO [facility] Internal [facility] Resident PGY-3 Clinic Phone: [phone] Please contact the on call pager at  [phone] for any urgent or emergent needs.

## 2023-12-23 NOTE — Patient Instructions (Signed)
 Thank you, Mr.Adarrius Ingles, for allowing us  to provide your care today. Today we discussed . . .  > Hypertension       - Send your blood pressure is still elevated even after restarting your medicines we will add a third medication called amlodipine .  I would like you to continue to take your hydrochlorothiazide  and losartan  and check your blood pressure every morning.  Our goal is to get your blood pressure under 130/80 so if your blood pressure does not get lower than that most of the time after starting this medication please call our office in 3 to 4 weeks and we may need to increase the amlodipine .  If you have any side effects such as lower extremity swelling or any dizziness especially when getting up from a seated position please let us  know as well. > Sexual exposure       - Today we will test for herpes simplex virus antibodies which we will show if you have ever been exposed to the herpes virus.  Please remember it is very common that people get exposed to the herpes virus and if this test is positive it does not confirm that you were exposed from your recent partner.  If it is positive we can send you an as needed medication to have if you ever develop a sore. > Colonoscopy       - Please call the gastroenterology office at [phone] to schedule a follow-up to repeat your colonoscopy.   I have ordered the following labs for you:   Lab Orders         Basic metabolic panel with GFR         HSV 1 and 2 Ab, IgG      Follow up: 3 months    Remember:  Should you have any questions or concerns please call the internal medicine clinic at [phone].     Fairy Pool, DO [facility]

## 2023-12-24 LAB — HSV 1 AND 2 AB, IGG
HSV 1 Glycoprotein G Ab, IgG: NONREACTIVE
HSV 2 IgG, Type Spec: NONREACTIVE

## 2023-12-24 LAB — BASIC METABOLIC PANEL WITH GFR
BUN/Creatinine Ratio: 13 (ref 9–20)
BUN: 12 mg/dL (ref 6–24)
CO2: 24 mmol/L (ref 20–29)
Calcium: 10 mg/dL (ref 8.7–10.2)
Chloride: 105 mmol/L (ref 96–106)
Creatinine, Ser: 0.96 mg/dL (ref 0.76–1.27)
Glucose: 79 mg/dL (ref 70–99)
Potassium: 4.4 mmol/L (ref 3.5–5.2)
Sodium: 141 mmol/L (ref 134–144)
eGFR: 92 mL/min/1.73 (ref >59)

## 2023-12-25 NOTE — Progress Notes (Signed)
 [facility] Attending  Case discussed with the resident at the time of the visit.  We reviewed the resident's history and exam and pertinent patient test results.  I agree with the assessment, diagnosis, and plan of care documented in the resident's note.

## 2024-01-06 ENCOUNTER — Other Ambulatory Visit: Payer: Self-pay | Admitting: Student

## 2024-01-06 DIAGNOSIS — I10 Essential (primary) hypertension: Secondary | ICD-10-CM

## 2024-01-31 ENCOUNTER — Other Ambulatory Visit: Payer: Self-pay | Admitting: Medical Genetics

## 2024-01-31 DIAGNOSIS — Z006 Encounter for examination for normal comparison and control in clinical research program: Secondary | ICD-10-CM

## 2024-02-04 ENCOUNTER — Other Ambulatory Visit
